# Patient Record
Sex: Male | Born: 1950
Health system: Southern US, Community
[De-identification: ages and names within clinical notes are randomized; demographics above are authoritative.]

## PROBLEM LIST (undated history)

## (undated) DIAGNOSIS — M1990 Unspecified osteoarthritis, unspecified site: Secondary | ICD-10-CM

## (undated) DIAGNOSIS — N400 Enlarged prostate without lower urinary tract symptoms: Secondary | ICD-10-CM

## (undated) DIAGNOSIS — F419 Anxiety disorder, unspecified: Secondary | ICD-10-CM

## (undated) DIAGNOSIS — C801 Malignant (primary) neoplasm, unspecified: Secondary | ICD-10-CM

## (undated) HISTORY — PX: KNEE ARTHROSCOPY: SHX127

## (undated) HISTORY — PX: PROSTATE BIOPSY: SHX241

---

## 1997-10-09 ENCOUNTER — Ambulatory Visit (HOSPITAL_COMMUNITY): Admission: RE | Admit: 1997-10-09 | Discharge: 1997-10-09 | Payer: Self-pay | Admitting: Neurosurgery

## 2002-06-26 ENCOUNTER — Ambulatory Visit (HOSPITAL_COMMUNITY): Admission: RE | Admit: 2002-06-26 | Discharge: 2002-06-26 | Payer: Self-pay | Admitting: Family Medicine

## 2002-06-26 ENCOUNTER — Encounter: Payer: Self-pay | Admitting: Family Medicine

## 2004-07-17 ENCOUNTER — Ambulatory Visit: Payer: Self-pay

## 2006-04-21 ENCOUNTER — Ambulatory Visit: Payer: Self-pay | Admitting: Gastroenterology

## 2006-04-28 ENCOUNTER — Ambulatory Visit: Payer: Self-pay | Admitting: Gastroenterology

## 2007-01-05 ENCOUNTER — Emergency Department (HOSPITAL_COMMUNITY): Admission: EM | Admit: 2007-01-05 | Discharge: 2007-01-05 | Payer: Self-pay | Admitting: Emergency Medicine

## 2010-07-08 HISTORY — PX: OTHER SURGICAL HISTORY: SHX169

## 2012-03-20 ENCOUNTER — Other Ambulatory Visit: Payer: Self-pay | Admitting: Orthopedic Surgery

## 2012-03-25 ENCOUNTER — Encounter (HOSPITAL_COMMUNITY): Admission: RE | Payer: Self-pay | Source: Ambulatory Visit

## 2012-03-25 ENCOUNTER — Ambulatory Visit (HOSPITAL_COMMUNITY): Admission: RE | Admit: 2012-03-25 | Payer: Self-pay | Source: Ambulatory Visit | Admitting: Orthopedic Surgery

## 2012-03-25 SURGERY — KYPHOPLASTY
Anesthesia: General | Laterality: Right

## 2012-09-07 ENCOUNTER — Emergency Department (HOSPITAL_COMMUNITY)
Admission: EM | Admit: 2012-09-07 | Discharge: 2012-09-07 | Disposition: A | Discharge status: Home / Self Care | Payer: Self-pay | Attending: Emergency Medicine | Admitting: Emergency Medicine

## 2012-09-07 ENCOUNTER — Encounter (HOSPITAL_COMMUNITY): Payer: Self-pay | Admitting: *Deleted

## 2012-09-07 DIAGNOSIS — R300 Dysuria: Secondary | ICD-10-CM | POA: Insufficient documentation

## 2012-09-07 DIAGNOSIS — Z79899 Other long term (current) drug therapy: Secondary | ICD-10-CM | POA: Insufficient documentation

## 2012-09-07 DIAGNOSIS — Z87440 Personal history of urinary (tract) infections: Secondary | ICD-10-CM | POA: Insufficient documentation

## 2012-09-07 DIAGNOSIS — R3915 Urgency of urination: Secondary | ICD-10-CM | POA: Insufficient documentation

## 2012-09-07 DIAGNOSIS — N400 Enlarged prostate without lower urinary tract symptoms: Secondary | ICD-10-CM

## 2012-09-07 DIAGNOSIS — N401 Enlarged prostate with lower urinary tract symptoms: Secondary | ICD-10-CM | POA: Insufficient documentation

## 2012-09-07 LAB — URINALYSIS, ROUTINE W REFLEX MICROSCOPIC
Bilirubin Urine: NEGATIVE
Nitrite: NEGATIVE
Specific Gravity, Urine: 1.016 (ref 1.005–1.030)
pH: 7 (ref 5.0–8.0)

## 2012-09-07 MED ORDER — CIPROFLOXACIN HCL 500 MG PO TABS: 500.0000 mg | ORAL_TABLET | Freq: Two times a day (BID) | ORAL | Status: DC

## 2012-09-07 NOTE — ED Provider Notes (Signed)
History     CSN: [id number]  Arrival date & time 09/07/12  1303   First MD Initiated Contact with Patient 09/07/12 1340      Chief Complaint  Patient presents with  . Urinary Retention    (Consider location/radiation/quality/duration/timing/severity/associated sxs/prior treatment) The history is provided by the patient.   Patient here complaining of dysuria and urinary urgency. No fever or back pain. No vomiting diarrhea. History of BPH in the past. Patient also recently diagnosed with a UTI and is on Bactrim. Patient notes she has been compliant with his medications. He denies any hematuria. Symptoms worse with urination and better with nothing History reviewed. No pertinent past medical history.  Past Surgical History  Procedure Laterality Date  . Prostate biopsy      History reviewed. No pertinent family history.  History  Substance Use Topics  . Smoking status: Never Smoker   . Smokeless tobacco: Never Used  . Alcohol Use: Yes      Review of Systems  All other systems reviewed and are negative.    Allergies  Review of patient's allergies indicates no known allergies.  Home Medications   Current Outpatient Rx  Name  Route  Sig  Dispense  Refill  . ALPRAZolam (XANAX) 1 MG tablet   Oral   Take 1 mg by mouth 3 (three) times daily.         . sertraline (ZOLOFT) 100 MG tablet   Oral   Take 50 mg by mouth daily.         Marland Kitchen sulfamethoxazole-trimethoprim (BACTRIM DS,SEPTRA DS) 800-160 MG per tablet   Oral   Take 1 tablet by mouth 2 (two) times daily.           BP 108/61  Pulse 80  Temp(Src) 98.7 F (37.1 C) (Oral)  Resp 18  SpO2 95%  Physical Exam  Nursing note and vitals reviewed. Constitutional: He is oriented to person, place, and time. He appears well-developed and well-nourished.  Non-toxic appearance. No distress.  HENT:  Head: Normocephalic and atraumatic.  Eyes: Conjunctivae, EOM and lids are normal. Pupils are equal, round, and  reactive to light.  Neck: Normal range of motion. Neck supple. No tracheal deviation present. No mass present.  Cardiovascular: Normal rate, regular rhythm and normal heart sounds.  Exam reveals no gallop.   No murmur heard. Pulmonary/Chest: Effort normal and breath sounds normal. No stridor. No respiratory distress. He has no decreased breath sounds. He has no wheezes. He has no rhonchi. He has no rales.  Abdominal: Soft. Normal appearance and bowel sounds are normal. He exhibits no distension. There is no tenderness. There is no rigidity, no rebound, no guarding and no CVA tenderness.  Musculoskeletal: Normal range of motion. He exhibits no edema and no tenderness.  Neurological: He is alert and oriented to person, place, and time. He has normal strength. No cranial nerve deficit or sensory deficit. GCS eye subscore is 4. GCS verbal subscore is 5. GCS motor subscore is 6.  Skin: Skin is warm and dry. No abrasion and no rash noted.  Psychiatric: He has a normal mood and affect. His speech is normal and behavior is normal.    ED Course  Procedures (including critical care time)  Labs Reviewed  URINE CULTURE  URINALYSIS, ROUTINE W REFLEX MICROSCOPIC   No results found.   No diagnosis found.    MDM  Patient to be treated for his presumed prostatitis with cipro--will stop bactrim  Toy Baker, MD 09/07/12 [phone]

## 2012-09-07 NOTE — ED Notes (Signed)
Pt states this past Friday started having a fever, dysuria, nausea, vomiting, went to doctor was told had a UTI and placed on Bactrim, states has been taking Bactrim but now having difficulty emptying bladder or getting urine to start. Pt states he feels sore in his genital area.

## 2012-09-07 NOTE — ED Notes (Signed)
Scott Flynn [phone] (daughters cellphone number)

## 2012-09-08 LAB — URINE CULTURE

## 2015-07-11 DIAGNOSIS — M778 Other enthesopathies, not elsewhere classified: Secondary | ICD-10-CM | POA: Diagnosis not present

## 2018-07-06 ENCOUNTER — Other Ambulatory Visit: Payer: Self-pay | Admitting: Otolaryngology

## 2018-07-06 DIAGNOSIS — J339 Nasal polyp, unspecified: Secondary | ICD-10-CM

## 2018-07-06 DIAGNOSIS — J300 Vasomotor rhinitis: Secondary | ICD-10-CM

## 2018-07-09 ENCOUNTER — Ambulatory Visit
Admission: RE | Admit: 2018-07-09 | Discharge: 2018-07-09 | Disposition: A | Discharge status: Home / Self Care | Payer: Medicare Other | Source: Ambulatory Visit | Attending: Otolaryngology | Admitting: Otolaryngology

## 2018-07-09 DIAGNOSIS — J300 Vasomotor rhinitis: Secondary | ICD-10-CM

## 2018-07-09 DIAGNOSIS — J339 Nasal polyp, unspecified: Secondary | ICD-10-CM

## 2018-07-17 ENCOUNTER — Other Ambulatory Visit: Payer: Self-pay

## 2018-07-17 ENCOUNTER — Encounter (HOSPITAL_BASED_OUTPATIENT_CLINIC_OR_DEPARTMENT_OTHER): Payer: Self-pay | Admitting: *Deleted

## 2018-07-20 ENCOUNTER — Encounter (HOSPITAL_COMMUNITY): Payer: Self-pay

## 2018-07-20 ENCOUNTER — Other Ambulatory Visit: Payer: Self-pay

## 2018-07-20 ENCOUNTER — Ambulatory Visit: Payer: Self-pay | Admitting: Otolaryngology

## 2018-07-20 NOTE — H&P (Signed)
PREOPERATIVE H&P  Chief Complaint: chronic nasal obstruction with history of chronic sinus problems  HPI: Scott Flynn is a 68 y.o. male who presents for evaluation of chronic nasal obstruction and sinus issues. He's had previous history of sinus problems more on the right side. But more recently has had trouble breathing through the left side of his nose despite multiple rounds of antibiotics and steroids. On exam he has sinonasal polyps left side worse than right. He has moderate septal deviation with posterior left septal spur. He had a CT scan that showed pansinus disease with minimal disease within the sphenoid region. He is taken to the operating room this time for FESS, turbinate reductions and possible septoplasty.  Past Medical History:  Diagnosis Date  . Anxiety   . Enlarged prostate    Past Surgical History:  Procedure Laterality Date  . KNEE ARTHROSCOPY    . PROSTATE BIOPSY     Social History   Socioeconomic History  . Marital status: Single    Spouse name: Not on file  . Number of children: Not on file  . Years of education: Not on file  . Highest education level: Not on file  Occupational History  . Not on file  Social Needs  . Financial resource strain: Not on file  . Food insecurity:    Worry: Not on file    Inability: Not on file  . Transportation needs:    Medical: Not on file    Non-medical: Not on file  Tobacco Use  . Smoking status: Never Smoker  . Smokeless tobacco: Never Used  Substance and Sexual Activity  . Alcohol use: Never    Frequency: Never  . Drug use: No  . Sexual activity: Not on file  Lifestyle  . Physical activity:    Days per week: Not on file    Minutes per session: Not on file  . Stress: Not on file  Relationships  . Social connections:    Talks on phone: Not on file    Gets together: Not on file    Attends religious service: Not on file    Active member of club or organization: Not on file    Attends meetings of clubs  or organizations: Not on file    Relationship status: Not on file  Other Topics Concern  . Not on file  Social History Narrative  . Not on file   History reviewed. No pertinent family history. No Known Allergies Prior to Admission medications   Medication Sig Start Date End Date Taking? Authorizing Provider  ALPRAZolam Duanne Moron) 1 MG tablet Take 1 mg by mouth 3 (three) times daily.   Yes [provider]  fluticasone (FLONASE) 50 MCG/ACT nasal spray Place 2 sprays into both nostrils at bedtime.   Yes [provider]  tamsulosin (FLOMAX) 0.4 MG CAPS capsule Take 0.4 mg by mouth every evening.    Yes [provider]     Positive ROS: chronic nasal obstruction per history of present illness  All other systems have been reviewed and were otherwise negative with the exception of those mentioned in the HPI and as above.  Physical Exam: There were no vitals filed for this visit.  General: Alert, no acute distress Oral: Normal oral mucosa and tonsils Nasal: moderate septal deviation with large turbinates. Diffuse mucosal swelling with polyps within the posterior nasal cavity Neck: No palpable adenopathy or thyroid nodules Ear: Ear canal is clear with normal appearing TMs Cardiovascular: Regular rate and rhythm,  no murmur.  Respiratory: Clear to auscultation Neurologic: Alert and oriented x 3   Assessment/Plan: deviated septum, hypertrophy of turbinates, polypoid sinus degeneration Plan for Procedure(s): NASAL SEPTOPLASTY WITH TURBINATE REDUCTION TOTAL ETHMOIDECTOMY ENDOSCOPIC SINUS SURGERY, MAXILLARY ENLARGEMENT SINUS ENDO WITH FUSION   Melony Overly, MD 07/20/2018 10:10 AM

## 2018-07-20 NOTE — Progress Notes (Signed)
PCP - unknown  Cardiologist - Denies  Chest x-ray - Denies  EKG - 01/06/18 (CE)-Req'd- Pt sts he was dieting at the time of the dizziness episodes. No new episode since then, and he is no longer dieting.  Stress Test - Denies  ECHO - Denies  Cardiac Cath - Denies  AICD- na PM- na LOOP- na  Sleep Study - Denies CPAP - None  LABS- 07/21/2018: CBC, BMP  ASA- Denies    Anesthesia- Yes- Req'd EKG.  Pt denies having any chest pain, sob, or fever during the pre-op phone call. All instructions explained to the pt, with a verbal understanding of the material including: As of today, stop taking all Other Aspirin Products, Vitamins, Fish oils, and Herbal medications. Also stop all NSAIDS i.e. Advil, Ibuprofen, Motrin, Aleve, Anaprox, Naproxen, BC, Goody Powders, and all Supplements.. The opportunity to ask questions was provided.

## 2018-07-21 ENCOUNTER — Encounter (HOSPITAL_COMMUNITY): Payer: Self-pay

## 2018-07-21 ENCOUNTER — Ambulatory Visit (HOSPITAL_COMMUNITY)
Admission: RE | Admit: 2018-07-21 | Discharge: 2018-07-21 | Disposition: A | Discharge status: Home / Self Care | Payer: Medicare Other | Attending: Otolaryngology | Admitting: Otolaryngology

## 2018-07-21 ENCOUNTER — Ambulatory Visit (HOSPITAL_COMMUNITY): Payer: Medicare Other | Admitting: Certified Registered Nurse Anesthetist

## 2018-07-21 ENCOUNTER — Encounter (HOSPITAL_COMMUNITY)
Admission: RE | Disposition: A | Discharge status: Home / Self Care | Payer: Self-pay | Source: Home / Self Care | Attending: Otolaryngology

## 2018-07-21 DIAGNOSIS — J339 Nasal polyp, unspecified: Secondary | ICD-10-CM | POA: Diagnosis not present

## 2018-07-21 DIAGNOSIS — J331 Polypoid sinus degeneration: Secondary | ICD-10-CM | POA: Diagnosis not present

## 2018-07-21 DIAGNOSIS — Z7951 Long term (current) use of inhaled steroids: Secondary | ICD-10-CM | POA: Diagnosis not present

## 2018-07-21 DIAGNOSIS — F419 Anxiety disorder, unspecified: Secondary | ICD-10-CM | POA: Diagnosis not present

## 2018-07-21 DIAGNOSIS — J343 Hypertrophy of nasal turbinates: Secondary | ICD-10-CM | POA: Insufficient documentation

## 2018-07-21 DIAGNOSIS — J342 Deviated nasal septum: Secondary | ICD-10-CM | POA: Insufficient documentation

## 2018-07-21 DIAGNOSIS — N400 Enlarged prostate without lower urinary tract symptoms: Secondary | ICD-10-CM | POA: Insufficient documentation

## 2018-07-21 DIAGNOSIS — Z79899 Other long term (current) drug therapy: Secondary | ICD-10-CM | POA: Insufficient documentation

## 2018-07-21 HISTORY — PX: SINUS ENDO WITH FUSION: SHX5329

## 2018-07-21 HISTORY — PX: ETHMOIDECTOMY: SHX5197

## 2018-07-21 HISTORY — DX: Benign prostatic hyperplasia without lower urinary tract symptoms: N40.0

## 2018-07-21 HISTORY — PX: NASAL SEPTOPLASTY W/ TURBINOPLASTY: SHX2070

## 2018-07-21 HISTORY — PX: NASAL SINUS SURGERY: SHX719

## 2018-07-21 HISTORY — DX: Anxiety disorder, unspecified: F41.9

## 2018-07-21 LAB — BASIC METABOLIC PANEL
ANION GAP: 8 (ref 5–15)
BUN: 18 mg/dL (ref 8–23)
CHLORIDE: 109 mmol/L (ref 98–111)
CO2: 21 mmol/L — ABNORMAL LOW (ref 22–32)
CREATININE: 0.89 mg/dL (ref 0.61–1.24)
Calcium: 8.8 mg/dL — ABNORMAL LOW (ref 8.9–10.3)
GFR calc Af Amer: >60 mL/min (ref >60)
GFR calc non Af Amer: >60 mL/min (ref >60)
GLUCOSE: 95 mg/dL (ref 70–99)
POTASSIUM: 3.9 mmol/L (ref 3.5–5.1)
Sodium: 138 mmol/L (ref 135–145)

## 2018-07-21 LAB — CBC
HCT: 44.1 % (ref 39.0–52.0)
Hemoglobin: 14.9 g/dL (ref 13.0–17.0)
MCH: 31.8 pg (ref 26.0–34.0)
MCHC: 33.8 g/dL (ref 30.0–36.0)
MCV: 94.2 fL (ref 80.0–100.0)
NRBC: 0 % (ref 0.0–0.2)
Platelets: 194 10*3/uL (ref 150–400)
RBC: 4.68 MIL/uL (ref 4.22–5.81)
RDW: 13.2 % (ref 11.5–15.5)
WBC: 6.5 10*3/uL (ref 4.0–10.5)

## 2018-07-21 SURGERY — SEPTOPLASTY, NOSE, WITH NASAL TURBINATE REDUCTION
Anesthesia: General | Site: Nose

## 2018-07-21 MED ORDER — MIDAZOLAM HCL 2 MG/2ML IJ SOLN: 1.0000 mg | INTRAMUSCULAR | Status: DC | PRN

## 2018-07-21 MED ORDER — LIDOCAINE-EPINEPHRINE 1 %-1:100000 IJ SOLN
INTRAMUSCULAR | Status: AC
  Filled 2018-07-21: qty 1

## 2018-07-21 MED ORDER — ONDANSETRON HCL 4 MG/2ML IJ SOLN
INTRAMUSCULAR | Status: DC | PRN
  Administered 2018-07-21: 4 mg via INTRAVENOUS

## 2018-07-21 MED ORDER — CEFAZOLIN SODIUM-DEXTROSE 2-4 GM/100ML-% IV SOLN
2.0000 g | INTRAVENOUS | Status: AC
  Administered 2018-07-21: 2 g via INTRAVENOUS
  Filled 2018-07-21: qty 100

## 2018-07-21 MED ORDER — LACTATED RINGERS IV SOLN
INTRAVENOUS | Status: DC
  Administered 2018-07-21 (×2): via INTRAVENOUS

## 2018-07-21 MED ORDER — SODIUM CHLORIDE 0.9 % IR SOLN
Status: DC | PRN
  Administered 2018-07-21: 1000 mL

## 2018-07-21 MED ORDER — COCAINE HCL 4 % EX SOLN
CUTANEOUS | Status: AC
  Filled 2018-07-21: qty 4

## 2018-07-21 MED ORDER — FENTANYL CITRATE (PF) 250 MCG/5ML IJ SOLN
INTRAMUSCULAR | Status: DC | PRN
  Administered 2018-07-21 (×4): 50 ug via INTRAVENOUS
  Administered 2018-07-21 (×2): 25 ug via INTRAVENOUS
  Administered 2018-07-21: 100 ug via INTRAVENOUS
  Administered 2018-07-21 (×3): 50 ug via INTRAVENOUS

## 2018-07-21 MED ORDER — MUPIROCIN 2 % EX OINT
TOPICAL_OINTMENT | CUTANEOUS | Status: AC
  Filled 2018-07-21: qty 22

## 2018-07-21 MED ORDER — SUGAMMADEX SODIUM 200 MG/2ML IV SOLN
INTRAVENOUS | Status: DC | PRN
  Administered 2018-07-21: 200 mg via INTRAVENOUS

## 2018-07-21 MED ORDER — ROCURONIUM BROMIDE 10 MG/ML (PF) SYRINGE
PREFILLED_SYRINGE | INTRAVENOUS | Status: DC | PRN
  Administered 2018-07-21: 50 mg via INTRAVENOUS

## 2018-07-21 MED ORDER — ONDANSETRON HCL 4 MG/2ML IJ SOLN: 4.0000 mg | Freq: Once | INTRAMUSCULAR | Status: DC | PRN

## 2018-07-21 MED ORDER — BACITRACIN ZINC 500 UNIT/GM EX OINT
TOPICAL_OINTMENT | CUTANEOUS | Status: AC
  Filled 2018-07-21: qty 28.35

## 2018-07-21 MED ORDER — LIDOCAINE-EPINEPHRINE 1 %-1:100000 IJ SOLN
INTRAMUSCULAR | Status: DC | PRN
  Administered 2018-07-21: 10 mL

## 2018-07-21 MED ORDER — 0.9 % SODIUM CHLORIDE (POUR BTL) OPTIME
TOPICAL | Status: DC | PRN
  Administered 2018-07-21: 1000 mL

## 2018-07-21 MED ORDER — DEXAMETHASONE SODIUM PHOSPHATE 10 MG/ML IJ SOLN
INTRAMUSCULAR | Status: DC | PRN
  Administered 2018-07-21: 10 mg via INTRAVENOUS

## 2018-07-21 MED ORDER — MUPIROCIN 2 % EX OINT
TOPICAL_OINTMENT | CUTANEOUS | Status: DC | PRN
  Administered 2018-07-21: 1 via TOPICAL

## 2018-07-21 MED ORDER — HYDROCODONE-ACETAMINOPHEN 5-325 MG PO TABS: 1.0000 | ORAL_TABLET | Freq: Four times a day (QID) | ORAL | 0 refills | Status: AC | PRN

## 2018-07-21 MED ORDER — FENTANYL CITRATE (PF) 100 MCG/2ML IJ SOLN: 25.0000 ug | INTRAMUSCULAR | Status: DC | PRN

## 2018-07-21 MED ORDER — PROPOFOL 10 MG/ML IV BOLUS
INTRAVENOUS | Status: AC
  Filled 2018-07-21: qty 20

## 2018-07-21 MED ORDER — CEPHALEXIN 500 MG PO CAPS: 500.0000 mg | ORAL_CAPSULE | Freq: Two times a day (BID) | ORAL | 0 refills | Status: AC

## 2018-07-21 MED ORDER — EPHEDRINE 5 MG/ML INJ
INTRAVENOUS | Status: AC
  Filled 2018-07-21: qty 10

## 2018-07-21 MED ORDER — FENTANYL CITRATE (PF) 100 MCG/2ML IJ SOLN: 50.0000 ug | INTRAMUSCULAR | Status: DC | PRN

## 2018-07-21 MED ORDER — OXYCODONE HCL 5 MG/5ML PO SOLN: 5.0000 mg | Freq: Once | ORAL | Status: DC | PRN

## 2018-07-21 MED ORDER — MIDAZOLAM HCL 2 MG/2ML IJ SOLN
INTRAMUSCULAR | Status: DC | PRN
  Administered 2018-07-21: 1 mg via INTRAVENOUS

## 2018-07-21 MED ORDER — OXYMETAZOLINE HCL 0.05 % NA SOLN
NASAL | Status: AC
  Filled 2018-07-21: qty 15

## 2018-07-21 MED ORDER — FENTANYL CITRATE (PF) 250 MCG/5ML IJ SOLN
INTRAMUSCULAR | Status: AC
  Filled 2018-07-21: qty 5

## 2018-07-21 MED ORDER — MIDAZOLAM HCL 2 MG/2ML IJ SOLN
INTRAMUSCULAR | Status: AC
  Filled 2018-07-21: qty 2

## 2018-07-21 MED ORDER — SCOPOLAMINE 1 MG/3DAYS TD PT72
1.0000 | MEDICATED_PATCH | Freq: Once | TRANSDERMAL | Status: DC | PRN
Start: 2018-07-21 — End: 2018-07-21
  Administered 2018-07-21: 1.5 mg via TRANSDERMAL
  Filled 2018-07-21: qty 1

## 2018-07-21 MED ORDER — CHLORHEXIDINE GLUCONATE CLOTH 2 % EX PADS: 6.0000 | MEDICATED_PAD | Freq: Once | CUTANEOUS | Status: DC

## 2018-07-21 MED ORDER — PROPOFOL 10 MG/ML IV BOLUS
INTRAVENOUS | Status: DC | PRN
  Administered 2018-07-21: 170 mg via INTRAVENOUS

## 2018-07-21 MED ORDER — OXYMETAZOLINE HCL 0.05 % NA SOLN
NASAL | Status: DC | PRN
  Administered 2018-07-21: 1

## 2018-07-21 MED ORDER — LIDOCAINE 2% (20 MG/ML) 5 ML SYRINGE
INTRAMUSCULAR | Status: DC | PRN
  Administered 2018-07-21: 40 mg via INTRAVENOUS

## 2018-07-21 MED ORDER — EPHEDRINE SULFATE 50 MG/ML IJ SOLN
INTRAMUSCULAR | Status: DC | PRN
  Administered 2018-07-21 (×4): 5 mg via INTRAVENOUS
  Administered 2018-07-21: 10 mg via INTRAVENOUS

## 2018-07-21 MED ORDER — OXYCODONE HCL 5 MG PO TABS: 5.0000 mg | ORAL_TABLET | Freq: Once | ORAL | Status: DC | PRN

## 2018-07-21 SURGICAL SUPPLY — 65 items
ATTRACTOMAT 16X20 MAGNETIC DRP (DRAPES) IMPLANT
BLADE 10 SAFETY STRL DISP (BLADE) ×2 IMPLANT
BLADE INF TURB ROT M4 2 5PK (BLADE) ×2 IMPLANT
BLADE RAD40 ROTATE 4M 4 5PK (BLADE) IMPLANT
BLADE RAD60 ROTATE M4 4 5PK (BLADE) IMPLANT
BLADE ROTATE TRICUT 4X13 M4 (BLADE) ×1 IMPLANT
BLADE SURG 15 STRL LF DISP TIS (BLADE) ×1 IMPLANT
BLADE SURG 15 STRL SS (BLADE) ×2
BLADE TRICUT ROTATE M4 4 5PK (BLADE) IMPLANT
CANISTER SUCT 3000ML (MISCELLANEOUS) ×2 IMPLANT
CANISTER SUCT 3000ML PPV (MISCELLANEOUS) ×2 IMPLANT
CLEANER TIP ELECTROSURG 2X2 (MISCELLANEOUS) ×2 IMPLANT
COAGULATOR SUCT 8FR VV (MISCELLANEOUS) IMPLANT
COAGULATOR SUCT SWTCH 10FR 6 (ELECTROSURGICAL) ×2 IMPLANT
COVER WAND RF STERILE (DRAPES) ×2 IMPLANT
DRAPE HALF SHEET 40X57 (DRAPES) IMPLANT
DRESSING NASAL KENNEDY 3.5X.9 (MISCELLANEOUS) IMPLANT
DRESSING TELFA 8X10 (GAUZE/BANDAGES/DRESSINGS) IMPLANT
DRSG NASAL KENNEDY 3.5X.9 (MISCELLANEOUS)
DRSG NASOPORE 8CM (GAUZE/BANDAGES/DRESSINGS) ×2 IMPLANT
DRSG TELFA 3X8 NADH (GAUZE/BANDAGES/DRESSINGS) IMPLANT
ELECT COATED BLADE 2.86 ST (ELECTRODE) ×2 IMPLANT
ELECT REM PT RETURN 9FT ADLT (ELECTROSURGICAL)
ELECTRODE REM PT RTRN 9FT ADLT (ELECTROSURGICAL) IMPLANT
FILTER ARTHROSCOPY CONVERTOR (FILTER) IMPLANT
GAUZE SPONGE 2X2 8PLY STRL LF (GAUZE/BANDAGES/DRESSINGS) IMPLANT
GLOVE SS BIOGEL STRL SZ 7.5 (GLOVE) ×2 IMPLANT
GLOVE SUPERSENSE BIOGEL SZ 7.5 (GLOVE) ×2
GOWN STRL REUS W/ TWL LRG LVL3 (GOWN DISPOSABLE) ×1 IMPLANT
GOWN STRL REUS W/ TWL XL LVL3 (GOWN DISPOSABLE) ×1 IMPLANT
GOWN STRL REUS W/TWL LRG LVL3 (GOWN DISPOSABLE) ×2
GOWN STRL REUS W/TWL XL LVL3 (GOWN DISPOSABLE) ×2
KIT BASIN OR (CUSTOM PROCEDURE TRAY) ×2 IMPLANT
KIT TURNOVER KIT B (KITS) ×2 IMPLANT
MARKER SPHERE PSV REFLC NDI (MISCELLANEOUS) ×10 IMPLANT
NDL HYPO 25GX1X1/2 BEV (NEEDLE) IMPLANT
NDL PRECISIONGLIDE 27X1.5 (NEEDLE) ×1 IMPLANT
NDL SPNL 25GX3.5 QUINCKE BL (NEEDLE) IMPLANT
NEEDLE HYPO 25GX1X1/2 BEV (NEEDLE) IMPLANT
NEEDLE PRECISIONGLIDE 27X1.5 (NEEDLE) ×2 IMPLANT
NEEDLE SPNL 25GX3.5 QUINCKE BL (NEEDLE) IMPLANT
NS IRRIG 1000ML POUR BTL (IV SOLUTION) ×2 IMPLANT
PAD ARMBOARD 7.5X6 YLW CONV (MISCELLANEOUS) ×4 IMPLANT
PAD DRESSING TELFA 3X8 NADH (GAUZE/BANDAGES/DRESSINGS) IMPLANT
PATTIES SURGICAL .5 X3 (DISPOSABLE) ×3 IMPLANT
PENCIL BUTTON HOLSTER BLD 10FT (ELECTRODE) IMPLANT
PENCIL FOOT CONTROL (ELECTRODE) ×2 IMPLANT
SOLUTION ANTI FOG 6CC (MISCELLANEOUS) ×2 IMPLANT
SPECIMEN JAR SMALL (MISCELLANEOUS) ×4 IMPLANT
SPLINT NASAL DOYLE BI-VL (GAUZE/BANDAGES/DRESSINGS) IMPLANT
SPONGE GAUZE 2X2 STER 10/PKG (GAUZE/BANDAGES/DRESSINGS)
STRIP CLOSURE SKIN 1/2X4 (GAUZE/BANDAGES/DRESSINGS) IMPLANT
SUT CHROMIC 3 0 PS 2 (SUTURE) IMPLANT
SUT CHROMIC 4 0 PS 2 18 (SUTURE) IMPLANT
SUT CHROMIC 5 0 P 3 (SUTURE) IMPLANT
SUT ETHILON 3 0 PS 1 (SUTURE) IMPLANT
SUT ETHILON 4 0 PS 2 18 (SUTURE) IMPLANT
SUT ETHILON 5 0 P 3 18 (SUTURE)
SUT NYLON ETHILON 5-0 P-3 1X18 (SUTURE) IMPLANT
SUT SILK 2 0 PERMA HAND 18 BK (SUTURE) ×2 IMPLANT
TOWEL OR 17X24 6PK STRL BLUE (TOWEL DISPOSABLE) ×2 IMPLANT
TRAY ENT MC OR (CUSTOM PROCEDURE TRAY) ×2 IMPLANT
TUBE CONNECTING 12X1/4 (SUCTIONS) ×2 IMPLANT
TUBE CONNECTING 20X1/4 (TUBING) ×1 IMPLANT
WATER STERILE IRR 1000ML POUR (IV SOLUTION) ×2 IMPLANT

## 2018-07-21 NOTE — Anesthesia Postprocedure Evaluation (Signed)
Anesthesia Post Note  Patient: Scott Flynn  Procedure(s) Performed: NASAL SEPTOPLASTY WITH TURBINATE REDUCTION (N/A Nose) TOTAL ETHMOIDECTOMY (N/A Nose) ENDOSCOPIC SINUS SURGERY, MAXILLARY ENLARGEMENT (N/A Nose) SINUS ENDO WITH FUSION (N/A Nose)     Patient location during evaluation: PACU Anesthesia Type: General Level of consciousness: awake and alert Pain management: pain level controlled Vital Signs Assessment: post-procedure vital signs reviewed and stable Respiratory status: spontaneous breathing, nonlabored ventilation, respiratory function stable and patient connected to nasal cannula oxygen Cardiovascular status: blood pressure returned to baseline and stable Postop Assessment: no apparent nausea or vomiting Anesthetic complications: no    Last Vitals:  Vitals:   07/21/18 0627 07/21/18 1045  BP: (!) 142/69 135/83  Pulse: 63 89  Resp: 18 12  Temp: 36.9 C (!) 36.4 C  SpO2: 96% 94%    Last Pain:  Vitals:   07/21/18 1045  TempSrc:   PainSc: 0-No pain                 [name]

## 2018-07-21 NOTE — Op Note (Signed)
NAME: Scott Flynn, Scott Flynn. 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  Turbinate hypertrophy.  Deviated septum.  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  Turbinate hypertrophy.  Deviated septum.  OPERATION PERFORMED:  Functional endoscopic sinus surgery with bilateral total ethmoidectomies and bilateral maxillary ostium enlargement with removal of polypoid disease using fusion.  Bilateral inferior turbinate reductions using the Medtronic  turbinate blade.  SURGEON:  Melony Overly, MD  ANESTHESIA:  General endotracheal.  ESTIMATED BLOOD LOSS:  150 mL.  COMPLICATIONS:  None.  BRIEF CLINICAL NOTE:  The patient is a 68 year old gentleman who has had longstanding chronic sinus problems with a history of nasal obstruction which has been worse on the left side more recently.  He underwent a CT scan that showed bilateral sinus  disease with intranasal polyps, worse on the left side.  He also has a moderate septal deflection.  He was taken to the operating room at this time for functional endoscopic sinus surgery with removal of intranasal and sinus polyps along with  ethmoidectomies and maxillary ostium enlargement.  He is going to undergo inferior turbinate reductions and possible septoplasty depending on findings at time of surgery.  DESCRIPTION OF PROCEDURE:  After adequate endotracheal anesthesia, the patient did have a moderate septal deflection, but this really was not obstructing access to the middle meatus on either side.  Most of his nasal obstruction is probably more related  to the polypoid disease that has fallen into the nasal cavity versus the septal deformity.  It was elected not to  perform septoplasty at this time and just perform a functional endoscopic sinus surgery with turbinate reductions.  After prepping the nose,  the nose was then decongested with Afrin in middle meatus areas, and inferior turbinates were injected with Xylocaine with epinephrine for hemostasis.  First, the right side was approached.  Uncinate process was incised and removed using the  microdebrider.  The anterior and a portion of the posterior ethmoid cells were opened up.  Fusion guidance was used to guide the surgery.  Care was taken to preserve the lamina papyracea as well as the roof of the ethmoid.  Of note, the ethmoid septa  were very thickened from chronic sinus disease.  In addition, the patient had polyps in the middle meatus as well as medial to the middle turbinate.  In order to remove the polyps from the medial aspect of the middle turbinates, the middle turbinate was  reflected laterally, and polyps were removed from the medial aspect of the middle turbinates posteriorly on both sides.  The patient had moderate bleeding during the procedure that was controlled with cotton pledgets soaked in Afrin.  On the right side,  the maxillary ostium was identified and was enlarged with backbiting and straight Thru-Cut forceps.  The procedure was repeated on the left side.  Again, the uncinate process was incised and removed.  Microdebrider was used to open up the anterior and  posterior ethmoid cells using Fusion guidance.  Maxillary ostia were then identified and were enlarged with backbiting and straight Thru-Cut forceps as well as flag-biting forceps.  Both maxillary ostia were enlarged to approximately a centimeter size.   Anterior and posterior ethmoid cells were opened up on both sides using Fusion navigation.  Polypoid disease was removed on the  left side.  The patient had large polyps extruding down posteriorly into the nasal cavity.  These were removed and sent as  specimens.  The patient also had  several polyps protruding from the medial aspect of the middle turbinates on both sides.  These were likewise removed with Takahashi forceps and suction cautery.  Following ethmoidectomies and maxillary ostium  enlargement, the inferior turbinate reductions were performed using Medtronic turbinate blade, submucosal turbinate reductions were performed bilaterally, and the turbinates were outfractured bilaterally.  Hemostasis was obtained with suction cautery.   Nasopore was used to pack the middle meatus on both sides.  This completed the procedure.  The patient was awoken from anesthesia and transferred to recovery room and postop doing well.  DISPOSITION:  The patient will be discharged home on Keflex 500 mg b.i.d. for 10 days, Tylenol, hydrocodone and ibuprofen p.r.n. pain.  He will follow up in my office in 3 days for recheck and cleaning the nasal cavity.  LN/NUANCE  D:07/21/2018 T:07/21/2018 JOB:[id number]

## 2018-07-21 NOTE — Anesthesia Preprocedure Evaluation (Signed)
Anesthesia Evaluation  Patient identified by MRN, date of birth, ID band Patient awake    Reviewed: Allergy & Precautions, NPO status , Patient's Chart, lab work & pertinent test results  Airway Mallampati: II  TM Distance: >3 FB Neck ROM: Full    Dental  (+) Teeth Intact, Dental Advisory Given   Pulmonary    breath sounds clear to auscultation       Cardiovascular  Rhythm:Regular Rate:Normal     Neuro/Psych    GI/Hepatic   Endo/Other    Renal/GU      Musculoskeletal   Abdominal   Peds  Hematology   Anesthesia Other Findings   Reproductive/Obstetrics                             Anesthesia Physical Anesthesia Plan  ASA: II  Anesthesia Plan: General   Post-op Pain Management:    Induction: Intravenous  PONV Risk Score and Plan: Dexamethasone and Ondansetron  Airway Management Planned: Oral ETT  Additional Equipment:   Intra-op Plan:   Post-operative Plan: Extubation in OR  Informed Consent: I have reviewed the patients History and Physical, chart, labs and discussed the procedure including the risks, benefits and alternatives for the proposed anesthesia with the patient or authorized representative who has indicated his/her understanding and acceptance.     Dental advisory given  Plan Discussed with: CRNA and Anesthesiologist  Anesthesia Plan Comments:         Anesthesia Quick Evaluation  

## 2018-07-21 NOTE — Transfer of Care (Signed)
Immediate Anesthesia Transfer of Care Note  Patient: Scott Flynn  Procedure(s) Performed: NASAL SEPTOPLASTY WITH TURBINATE REDUCTION (N/A Nose) TOTAL ETHMOIDECTOMY (N/A Nose) ENDOSCOPIC SINUS SURGERY, MAXILLARY ENLARGEMENT (N/A Nose) SINUS ENDO WITH FUSION (N/A Nose)  Patient Location: PACU  Anesthesia Type:General  Level of Consciousness: awake, alert  and patient cooperative  Airway & Oxygen Therapy: Patient Spontanous Breathing and Patient connected to face mask oxygen  Post-op Assessment: Report given to RN and Post -op Vital signs reviewed and stable  Post vital signs: Reviewed and stable  Last Vitals:  Vitals Value Taken Time  BP 135/83 07/21/2018 10:45 AM  Temp 36.4 C 07/21/2018 10:45 AM  Pulse 89 07/21/2018 10:47 AM  Resp 16 07/21/2018 10:47 AM  SpO2 92 % 07/21/2018 10:47 AM  Vitals shown include unvalidated device data.  Last Pain:  Vitals:   07/21/18 1045  TempSrc:   PainSc: 0-No pain      Patients Stated Pain Goal: 0 (88/89/16 9450)  Complications: No apparent anesthesia complications

## 2018-07-21 NOTE — Discharge Instructions (Addendum)
Keflex 500 mg BID for the next 10 days Tylenol, ibuprofen and or hydrocodone 5 mg 1-2 every 6 hrs prn pain Elevate head of bed and apply cool compress to the nose to reduce swelling and bleeding You can rinse the nasal cavity with saline nasal spray to reduce crusting and scabbing Return to see Dr Lucia Gaskins Friday at 4:15

## 2018-07-21 NOTE — Anesthesia Procedure Notes (Signed)
Procedure Name: Intubation Date/Time: 07/21/2018 7:45 AM Performed by: Elayne Snare, CRNA Pre-anesthesia Checklist: Patient identified, Emergency Drugs available, Suction available and Patient being monitored Patient Re-evaluated:Patient Re-evaluated prior to induction Oxygen Delivery Method: Circle System Utilized Preoxygenation: Pre-oxygenation with 100% oxygen Induction Type: IV induction Ventilation: Mask ventilation without difficulty Laryngoscope Size: Mac and 4 Grade View: Grade I Tube type: Oral Tube size: 7.5 mm Number of attempts: 1 Airway Equipment and Method: Stylet Placement Confirmation: ETT inserted through vocal cords under direct vision,  positive ETCO2 and breath sounds checked- equal and bilateral Secured at: 22 cm Tube secured with: Tape Dental Injury: Teeth and Oropharynx as per pre-operative assessment

## 2018-07-21 NOTE — Brief Op Note (Signed)
07/21/2018  10:33 AM  PATIENT:  Scott Flynn  68 y.o. male  PRE-OPERATIVE DIAGNOSIS:  deviated septum, hypertrophy of turbinates, polypoid sinus degeneration  POST-OPERATIVE DIAGNOSIS:  deviated septum, hypertrophy of turbinates, polypoid sinus degeneration  PROCEDURE:  FESS with BILATERAL TOTAL ETHMOIDECTOMY, BILATERAL MAXILLARY OSTIA ENLARGEMENT, with FUSION, BILATERAL INFERIOR TURBINATE REDUUCTIONS  SURGEON:  Surgeon(s) and Role:    Rozetta Nunnery, MD - Primary  PHYSICIAN ASSISTANT:   ASSISTANTS: none   ANESTHESIA:   general  EBL:  50 mL   BLOOD ADMINISTERED:none  DRAINS: none   LOCAL MEDICATIONS USED:  XYLOCAINE with EPI 15 cc  SPECIMEN:  Source of Specimen:  sinus polyps  DISPOSITION OF SPECIMEN:  PATHOLOGY  COUNTS:  YES  TOURNIQUET:  * No tourniquets in log *  DICTATION: .Other Dictation: Dictation Number [id number]  PLAN OF CARE: Discharge to home after PACU  PATIENT DISPOSITION:  PACU - hemodynamically stable.   Delay start of Pharmacological VTE agent (>24hrs) due to surgical blood loss or risk of bleeding: yes

## 2018-07-21 NOTE — Interval H&P Note (Signed)
History and Physical Interval Note:  07/21/2018 7:42 AM  Scott Flynn  has presented today for surgery, with the diagnosis of deviated septum, hypertrophy of turbinates, polypoid sinus degeneration  The various methods of treatment have been discussed with the patient and family. After consideration of risks, benefits and other options for treatment, the patient has consented to  Procedure(s): NASAL SEPTOPLASTY WITH TURBINATE REDUCTION (N/A) TOTAL ETHMOIDECTOMY (N/A) ENDOSCOPIC SINUS SURGERY, MAXILLARY ENLARGEMENT (N/A) SINUS ENDO WITH FUSION (N/A) as a surgical intervention .  The patient's history has been reviewed, patient examined, no change in status, stable for surgery.  I have reviewed the patient's chart and labs.  Questions were answered to the patient's satisfaction.     Melony Overly

## 2018-07-22 ENCOUNTER — Encounter (HOSPITAL_COMMUNITY): Payer: Self-pay | Admitting: Otolaryngology

## 2020-11-27 ENCOUNTER — Other Ambulatory Visit: Payer: Self-pay | Admitting: Urology

## 2020-11-27 DIAGNOSIS — R972 Elevated prostate specific antigen [PSA]: Secondary | ICD-10-CM

## 2020-12-14 ENCOUNTER — Ambulatory Visit
Admission: RE | Admit: 2020-12-14 | Discharge: 2020-12-14 | Disposition: A | Discharge status: Home / Self Care | Payer: Medicare Other | Source: Ambulatory Visit | Attending: Urology | Admitting: Urology

## 2020-12-14 ENCOUNTER — Other Ambulatory Visit: Payer: Self-pay

## 2020-12-14 DIAGNOSIS — R972 Elevated prostate specific antigen [PSA]: Secondary | ICD-10-CM

## 2020-12-14 IMAGING — MR MR PROSTATE WO/W CM
12 series · 48 of 48 positions shown · IV contrast (multihance)
Comparison: None.

CLINICAL DATA: Elevated PSA 9.77, evaluate for prostate cancer

EXAM:
MR PROSTATE WITHOUT AND WITH CONTRAST
TECHNIQUE: Multiplanar multisequence MRI images were obtained of the pelvis
centered about the prostate. Pre and post contrast images were
obtained.
CONTRAST:  20mL MULTIHANCE GADOBENATE DIMEGLUMINE 529 MG/ML IV SOLN

[Series 3: T2 · coronal · 3.0mm · 0.56mm/px · 1 of 30 slices shown (1 of 3)]
[im 1/30]
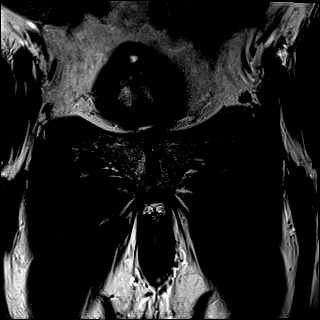

[Series 4: T1 · axial · 5.0mm · 1.25mm/px · 1 of 80 slices shown]
[im 1/80]
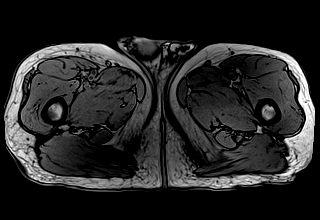

[Series 5: DWI · axial · 3.0mm · 1.75mm/px · 1 of 75 slices shown (1 of 3)]
[im 1/75]
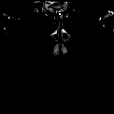

[Series 6: DWI · axial · 3.0mm · 1.75mm/px · 1 of 25 slices shown (2 of 3)]
[im 1/25]
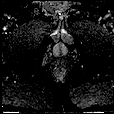

[Series 7: DWI · axial · 3.0mm · 1.75mm/px · 1 of 25 slices shown (3 of 3)]
[im 1/25]
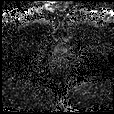

[Series 8: T2 · axial · 3.0mm · 0.56mm/px · 1 of 30 slices shown (2 of 3)]
[im 1/30]
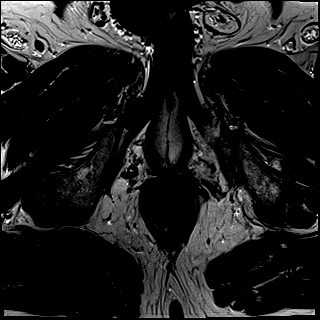

[Series 9: T2 · axial · 1.4mm · 1.35mm/px · z∈[-70,+41]mm · 2 of 80 slices shown (3 of 3)]
[im 1/80]
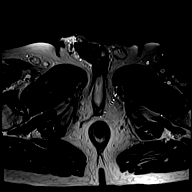
[im 80/80]
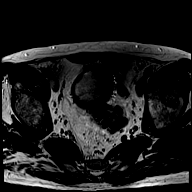

[Series 10: pre t1_twist_tra_dyn · axial · non-contrast · 3.5mm · 0.83mm/px · 1 of 24 slices shown]
[im 1/24]
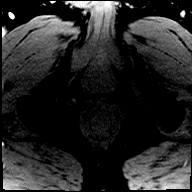

[Series 11: post t1_twist_tra_dyn-copy center · axial · non-contrast · 3.5mm · 0.83mm/px · z∈[-62,+19]mm · 18 of 720 slices shown]
[im 1/720]
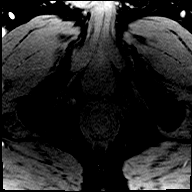
[im 43/720]
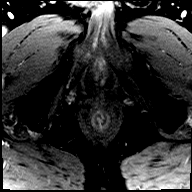
[im 85/720]
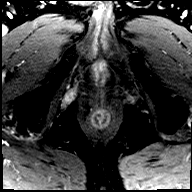
[im 127/720]
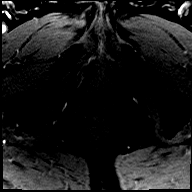
[im 170/720]
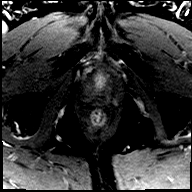
[im 212/720]
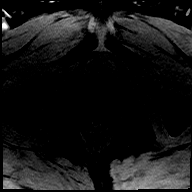
[im 254/720]
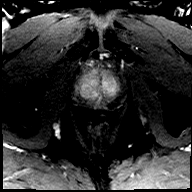
[im 297/720]
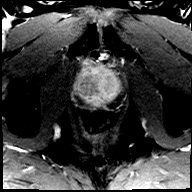
[im 339/720]
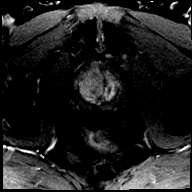
[im 381/720]
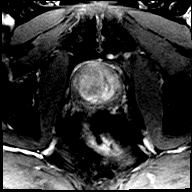
[im 423/720]
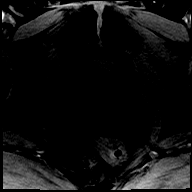
[im 466/720]
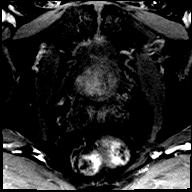
[im 508/720]
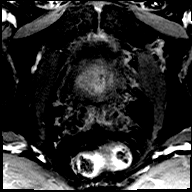
[im 550/720]
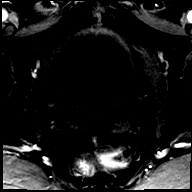
[im 593/720]
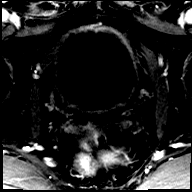
[im 635/720]
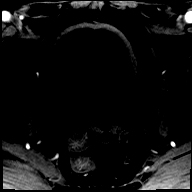
[im 677/720]
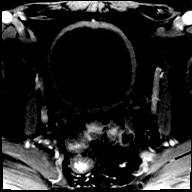
[im 720/720]
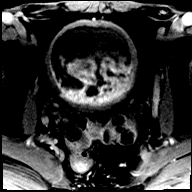

[Series 12: post t1_twist_tra_dyn-copy cent_sub · axial · 3.5mm · 0.83mm/px · z∈[-62,+19]mm · 17 of 684 slices shown]
[im 1/684]
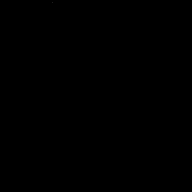
[im 43/684]
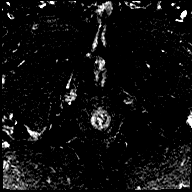
[im 86/684]
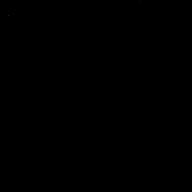
[im 129/684]
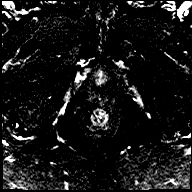
[im 171/684]
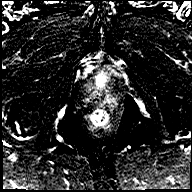
[im 214/684]
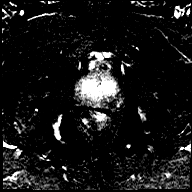
[im 257/684]
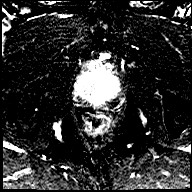
[im 299/684]
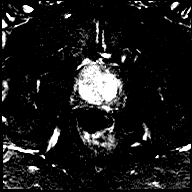
[im 342/684]
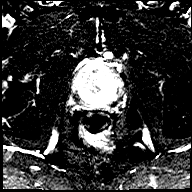
[im 385/684]
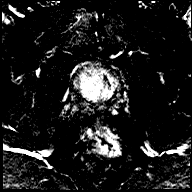
[im 427/684]
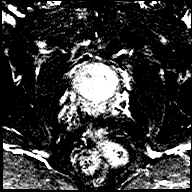
[im 470/684]
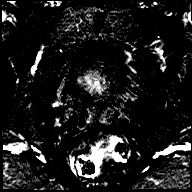
[im 513/684]
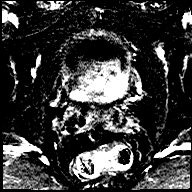
[im 555/684]
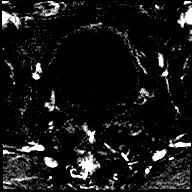
[im 598/684]
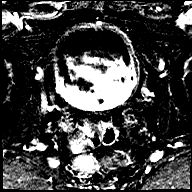
[im 641/684]
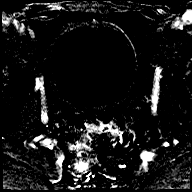
[im 684/684]
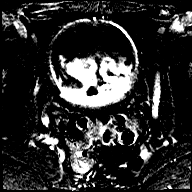

[Series 13: t1_vibe_dixon_tra_f · axial · 2.5mm · 0.91mm/px · z∈[-110,+87]mm · 2 of 80 slices shown]
[im 1/80]
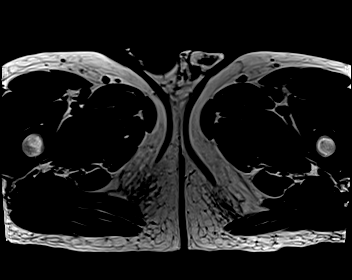
[im 80/80]
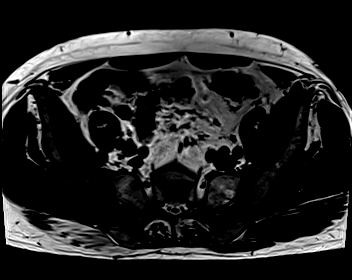

[Series 14: t1_vibe_dixon_tra_w · axial · 2.5mm · 0.91mm/px · z∈[-110,+87]mm · 2 of 80 slices shown]
[im 1/80]
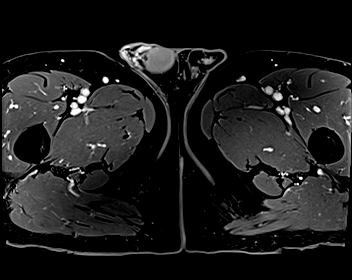
[im 80/80]
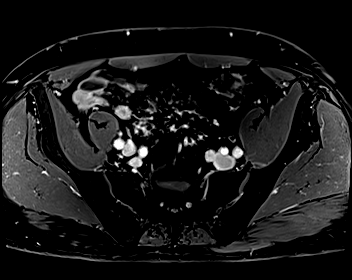

[48 of 48 positions shown; findings below may reference images not displayed]

FINDINGS: Prostate: No findings suspicious for high-grade prostate cancer in
the peripheral zone. Specifically, no focal low T2 lesion. No
restricted diffusion/low ADC. No early arterial enhancement.

Enlargement/nodularity of the central gland, suggesting BPH.

Lenticular, homogeneous low T2 signal at the left anterior apex of
the central gland (series 9/image 53), with nonspecific mild
restricted diffusion/low ADC (series [DATE]/image 18), without
convincing differential enhancement relative to background. Given
the T2 appearance, this is considered a PI-RADS 5 lesion.

Large, rounded, heterogeneous low T2 signal nodule in the right mid
central gland (series 9/image 46), with nonspecific mild restricted
diffusion/low ADC (series [DATE]/image 17), without convincing
differential enhancement relative to background. Given the T2
appearance, this is considered a PI-RADS 3 lesion.

Volume: 4.6 x 4.0 x 4.3 cm (calculated volume 40.0 mL).

Transcapsular spread: Left apical central gland lesion abuts but
does not transgress the anterior fibromuscular stroma.

Seminal vesicle involvement: Absent.

Neurovascular bundle involvement: Absent.

Pelvic adenopathy: Absent.

Bone metastasis: Absent.

Other findings: Extensive sigmoid diverticulosis, without evidence
of diverticulitis.
IMPRESSION: No findings suspicious for high-grade macroscopic prostate cancer in
the peripheral zone.

Two lesions in the central gland, as above, including a PI-RADS 5
lesion in the left anterior apex, raising the possibility of central
gland tumor. These lesions were marked in DynaCAD 3D software for
potential UroNAV biopsy.

No evidence of extracapsular extension, seminal vesicle invasion,
lymphadenopathy, or metastatic disease.

Calculated prostate volume 40 mL.

## 2020-12-14 MED ORDER — GADOBENATE DIMEGLUMINE 529 MG/ML IV SOLN
20.0000 mL | Freq: Once | INTRAVENOUS | Status: AC | PRN
  Administered 2020-12-14: 20 mL via INTRAVENOUS

## 2021-04-17 ENCOUNTER — Ambulatory Visit
Admission: RE | Admit: 2021-04-17 | Discharge: 2021-04-17 | Disposition: A | Discharge status: Home / Self Care | Payer: Medicare Other | Source: Ambulatory Visit | Attending: Radiation Oncology | Admitting: Radiation Oncology

## 2021-04-17 ENCOUNTER — Encounter: Payer: Self-pay | Admitting: Radiation Oncology

## 2021-04-17 ENCOUNTER — Other Ambulatory Visit: Payer: Self-pay

## 2021-04-17 VITALS — BP 120/75 | HR 66 | Temp 97.6°F | Resp 20 | Ht 74.0 in | Wt 209.4 lb

## 2021-04-17 DIAGNOSIS — Z7982 Long term (current) use of aspirin: Secondary | ICD-10-CM | POA: Diagnosis not present

## 2021-04-17 DIAGNOSIS — Z79899 Other long term (current) drug therapy: Secondary | ICD-10-CM | POA: Insufficient documentation

## 2021-04-17 DIAGNOSIS — C61 Malignant neoplasm of prostate: Secondary | ICD-10-CM | POA: Insufficient documentation

## 2021-04-17 NOTE — Progress Notes (Signed)
Radiation Oncology         [phone] ________________________________  Initial Outpatient Consultation  Name: Scott Flynn MRN: [id number]  Date: 04/17/2021  DOB: [birth year]  PY:[id number], Henderson Newcomer, MD  Remi Haggard, MD   REFERRING PHYSICIAN: Remi Haggard, MD  DIAGNOSIS: 70 y.o. 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    ICD-10-CM   1. Malignant neoplasm of prostate ([place])  C61       HISTORY OF PRESENT ILLNESS: Scott Flynn is a 70 y.o. male with a diagnosis of prostate cancer. He was previously seen by Dr. Jeffie Pollock in 2007 for an elevated PSA around 5. He underwent prostate biopsy in 06/2006 and again in 04/2007, both of which were negative for high-grade adenocarcinoma.  More recently, he was noted to have an elevated PSA of 9.77 by his primary care physician, Dr. Jonelle Sidle.  Accordingly, he was referred for evaluation in urology by Dr. Milford Cage on 11/23/20,  digital rectal examination was performed at that time revealing no nodules. He underwent prostate MRI on 12/14/20 showing two lesions in the central gland, including a PI-RADS 5 lesion in the left anterior apex and a PI-RADS 3 lesion in the right central gland.  There were no findings suspicious for high-grade macroscopic prostate cancer in the peripheral zone; no evidence of extracapsular extension, seminal vesicle invasion, lymphadenopathy, or metastatic disease.  The patient proceeded to MRI fusion biopsy of the prostate on 03/26/21.  The prostate volume measured 48.3 cc.  Out of 18 core biopsies, 9 were positive.  The maximum Gleason score was 3+4, and this was seen in 3 of 4 ROI MRI lesions samples from the PI-RADS 5 lesion on the left, 1 of 3 samples from the ROI lesion on the right and also in the left base lateral. Additionally, Gleason 3+3 was seen in the remaining 2 of 3 ROI MRI lesions samples from the PI-RADS 3 lesion on the right and also in the right  apex lateral, right mid lateral, right apex, and right base.  The patient reviewed the biopsy results with his urologist and he has kindly been referred today for discussion of potential radiation treatment options.   PREVIOUS RADIATION THERAPY: No  PAST MEDICAL HISTORY:  Past Medical History:  Diagnosis Date   Anxiety    Enlarged prostate       PAST SURGICAL HISTORY: Past Surgical History:  Procedure Laterality Date   ETHMOIDECTOMY N/A 07/21/2018   Procedure: TOTAL ETHMOIDECTOMY;  Surgeon: Rozetta Nunnery, MD;  Location: [facility];  Service: ENT;  Laterality: N/A;   KNEE ARTHROSCOPY     NASAL SEPTOPLASTY W/ TURBINOPLASTY N/A 07/21/2018   Procedure: NASAL SEPTOPLASTY WITH TURBINATE REDUCTION;  Surgeon: Rozetta Nunnery, MD;  Location: [facility] OR;  Service: ENT;  Laterality: N/A;   NASAL SINUS SURGERY N/A 07/21/2018   Procedure: ENDOSCOPIC SINUS SURGERY, MAXILLARY ENLARGEMENT;  Surgeon: Rozetta Nunnery, MD;  Location: [facility];  Service: ENT;  Laterality: N/A;   PROSTATE BIOPSY     SINUS ENDO WITH FUSION N/A 07/21/2018   Procedure: SINUS ENDO WITH FUSION;  Surgeon: Rozetta Nunnery, MD;  Location: [facility];  Service: ENT;  Laterality: N/A;    FAMILY HISTORY: History reviewed. No pertinent family history.  SOCIAL HISTORY:  Social History   Socioeconomic History   Marital status: Single    Spouse name: Not on file   Number of children: Not on file  Years of education: Not on file   Highest education level: Not on file  Occupational History   Not on file  Tobacco Use   Smoking status: Never   Smokeless tobacco: Never  Vaping Use   Vaping Use: Never used  Substance and Sexual Activity   Alcohol use: Never   Drug use: No   Sexual activity: Not on file  Other Topics Concern   Not on file  Social History Narrative   Not on file   Social Determinants of Health   Financial Resource Strain: Not on file  Food Insecurity: Not on file  Transportation Needs: Not on  file  Physical Activity: Not on file  Stress: Not on file  Social Connections: Not on file  Intimate Partner Violence: Not on file    ALLERGIES: Patient has no known allergies.  MEDICATIONS:  Current Outpatient Medications  Medication Sig Dispense Refill   aspirin 81 MG chewable tablet Chew 81 mg by mouth daily.     cycloSPORINE (RESTASIS) 0.05 % ophthalmic emulsion SMARTSIG:1 Drop(s) In Eye(s) Every 12 Hours     tamsulosin (FLOMAX) 0.4 MG CAPS capsule Take by mouth.     valACYclovir (VALTREX) 500 MG tablet Take by mouth.     ALPRAZolam (XANAX) 1 MG tablet Take 1 mg by mouth 3 (three) times daily.     fluticasone (FLONASE) 50 MCG/ACT nasal spray Place 2 sprays into both nostrils at bedtime.     tamsulosin (FLOMAX) 0.4 MG CAPS capsule Take 0.4 mg by mouth every evening.      No current facility-administered medications for this encounter.    REVIEW OF SYSTEMS:  On review of systems, the patient reports that he is doing well overall. He denies any chest pain, shortness of breath, cough, fevers, chills, night sweats, unintended weight changes. He denies any bowel disturbances, and denies abdominal pain, nausea or vomiting. He denies any new musculoskeletal or joint aches or pains.  He does have some persistent perineal discomfort that began in July 2022 and has not responded to antibiotic therapy for suspected prostatitis.  He has taken Aleve on occasion when the pain was significant and he does think that this was helpful.  His IPSS was 13, indicating mild-moderate urinary symptoms despite Flomax daily. His SHIM was 13, indicating he has moderate erectile dysfunction. A complete review of systems is obtained and is otherwise negative.    PHYSICAL EXAM:  Wt Readings from Last 3 Encounters:  04/17/21 209 lb 6.4 oz (95 kg)  07/21/18 230 lb (104.3 kg)   Temp Readings from Last 3 Encounters:  04/17/21 97.6 F (36.4 C) (Temporal)  07/21/18 97.7 F (36.5 C)  09/07/12 97.7 F (36.5 C)  (Oral)   BP Readings from Last 3 Encounters:  04/17/21 120/75  07/21/18 125/76  09/07/12 130/80   Pulse Readings from Last 3 Encounters:  04/17/21 66  07/21/18 90  09/07/12 88   Pain Assessment Pain Score: 1  Pain Loc: Abdomen/10  In general this is a well appearing Caucasian male in no acute distress. He's alert and oriented x4 and appropriate throughout the examination. Cardiopulmonary assessment is negative for acute distress, and he exhibits normal effort.     KPS = 90  100 - Normal; no complaints; no evidence of disease. 90   - Able to carry on normal activity; minor signs or symptoms of disease. 80   - Normal activity with effort; some signs or symptoms of disease. 37   - Cares for self; unable  to carry on normal activity or to do active work. 60   - Requires occasional assistance, but is able to care for most of his personal needs. 50   - Requires considerable assistance and frequent medical care. 51   - Disabled; requires special care and assistance. 37   - Severely disabled; hospital admission is indicated although death not imminent. 4   - Very sick; hospital admission necessary; active supportive treatment necessary. 10   - Moribund; fatal processes progressing rapidly. 0     - Dead  Karnofsky DA, [place], Craver LS and Burchenal [facility] [phone]) The use of the nitrogen mustards in the palliative treatment of carcinoma: with particular reference to bronchogenic carcinoma Cancer 1 634-56  LABORATORY DATA:  Lab Results  Component Value Date   WBC 6.5 07/21/2018   HGB 14.9 07/21/2018   HCT 44.1 07/21/2018   MCV 94.2 07/21/2018   PLT 194 07/21/2018   Lab Results  Component Value Date   NA 138 07/21/2018   K 3.9 07/21/2018   CL 109 07/21/2018   CO2 21 (L) 07/21/2018   No results found for: ALT, AST, GGT, ALKPHOS, BILITOT   RADIOGRAPHY: No results found.    IMPRESSION/PLAN: 1. 70 y.o. gentleman with Stage T1c adenocarcinoma of the prostate with Gleason Score  of 3+4, and PSA of 9.77. We discussed the patient's workup and outlined the nature of prostate cancer in this setting. The patient's T stage, Gleason's score, and PSA put him into the favorable intermediate risk group. Accordingly, he is eligible for a variety of potential treatment options including brachytherapy, 5.5 weeks of external radiation, or prostatectomy. We discussed the available radiation techniques, and focused on the details and logistics of delivery. We discussed and outlined the risks, benefits, short and long-term effects associated with radiotherapy and compared and contrasted these with prostatectomy. We discussed the role of SpaceOAR gel in reducing the rectal toxicity associated with radiotherapy. He appears to have a good understanding of his disease and our treatment recommendations which are of curative intent.  He was encouraged to ask questions that were answered to his stated satisfaction.  At the conclusion of our conversation, the patient is interested in moving forward with brachytherapy and use of SpaceOAR gel to reduce rectal toxicity from radiotherapy.  We will share our discussion with Dr. Milford Cage and move forward with scheduling his CT [facility] planning appointment in the near future.  The patient met briefly with Romie Jumper in our office who will be working closely with him to coordinate OR scheduling and pre and post procedure appointments.  We will contact the pharmaceutical rep to ensure that [employer] is available at the time of procedure.  We enjoyed meeting him today and look forward to continuing to participate in his care.   We personally spent 70 minutes in this encounter including chart review, reviewing radiological studies, meeting face-to-face with the patient, entering orders and completing documentation.    Nicholos Johns, PA-C    Tyler Pita, MD  [facility] Direct Dial: [phone]  Fax: [phone] [email or web address]   Skype  [email or web address]   This document serves as a record of services personally performed by Tyler Pita, MD and Freeman Caldron, PA-C. It was created on their behalf by Wilburn Mylar, a trained medical scribe. The creation of this record is based on the scribe's personal observations and the provider's statements to them. This document has been checked and approved by the attending provider.

## 2021-04-17 NOTE — Progress Notes (Signed)
GU Location of Tumor / Histology: Prostate Ca  If Prostate Cancer, Gleason Score is (4 + 3) and PSA is (9.77 as of 5/22)  Biopsies  Dr. Milford Cage        Past/Anticipated interventions by urology, if any:   Past/Anticipated interventions by medical oncology, if any:   Weight changes, if any:  No stable.  IPSS:  13 SHIM:  13  Bowel/Bladder complaints, if any:  No bowel or bladder issues.  Nausea/Vomiting, if any: No  Pain issues, if any:  1/10 to treatment site.  SAFETY ISSUES: Prior radiation?  No Pacemaker/ICD?  No Possible current pregnancy?  Male Is the patient on methotrexate? No  Current Complaints / other details:  Need more information on treatment.

## 2021-04-19 ENCOUNTER — Telehealth: Payer: Self-pay | Admitting: *Deleted

## 2021-04-19 NOTE — Telephone Encounter (Signed)
CALLED PATIENT TO UPDATE, SPOKE WITH PATIENT 

## 2021-04-23 ENCOUNTER — Other Ambulatory Visit: Payer: Self-pay | Admitting: Urology

## 2021-04-24 ENCOUNTER — Ambulatory Visit: Payer: Medicare Other | Admitting: Radiation Oncology

## 2021-04-24 ENCOUNTER — Ambulatory Visit: Payer: Medicare Other

## 2021-05-09 ENCOUNTER — Telehealth: Payer: Self-pay | Admitting: *Deleted

## 2021-05-09 NOTE — Telephone Encounter (Signed)
CALLED PATIENT TO REMIND OF PRE-SEED APPTS. FOR 05-10-21, SPOKE WITH PATIENT AND HE IS AWARE OF THESE APPTS.

## 2021-05-09 NOTE — Progress Notes (Signed)
  Radiation Oncology         [phone] ________________________________  Name: Scott Flynn MRN: [id number]  Date: 05/10/2021  DOB: 1951/02/11  SIMULATION AND TREATMENT PLANNING NOTE PUBIC ARCH STUDY  [id number], Henderson Newcomer, MD  Remi Haggard, MD  DIAGNOSIS:   70 y.o. 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  Oncology History  Malignant neoplasm of prostate ([place])  03/26/2021 Cancer Staging   Staging form: Prostate, AJCC 8th Edition - Clinical stage from 03/26/2021: Stage IIB (cT1c, cN0, cM0, PSA: 9.8, Grade Group: 2) - Signed by Freeman Caldron, PA-C on 04/17/2021 Histopathologic type: Adenocarcinoma, NOS Stage prefix: Initial diagnosis Prostate specific antigen (PSA) range: Less than 10 Gleason primary pattern: 3 Gleason secondary pattern: 4 Gleason score: 7 Histologic grading system: 5 grade system    04/17/2021 Initial Diagnosis   Malignant neoplasm of prostate ([place])       ICD-10-CM   1. Malignant neoplasm of prostate ([place])  C61       COMPLEX SIMULATION:  The patient presented today for evaluation for possible prostate seed implant. He was brought to the radiation planning suite and placed supine on the CT couch. A 3-dimensional image study set was obtained in upload to the planning computer. There, on each axial slice, I contoured the prostate gland. Then, using three-dimensional radiation planning tools I reconstructed the prostate in view of the structures from the transperineal needle pathway to assess for possible pubic arch interference. In doing so, I did not appreciate any pubic arch interference. Also, the patient's prostate volume was estimated based on the drawn structure. The volume was 49 cc.  Given the pubic arch appearance and prostate volume, patient remains a good candidate to proceed with prostate seed implant. Today, he freely provided informed written consent to proceed.    PLAN: The  patient will undergo prostate seed implant.   ________________________________  Sheral Apley. Tammi Klippel, M.D.

## 2021-05-10 ENCOUNTER — Ambulatory Visit
Admission: RE | Admit: 2021-05-10 | Discharge: 2021-05-10 | Disposition: A | Discharge status: Home / Self Care | Payer: Medicare Other | Source: Ambulatory Visit | Attending: Urology | Admitting: Urology

## 2021-05-10 ENCOUNTER — Ambulatory Visit
Admission: RE | Admit: 2021-05-10 | Discharge: 2021-05-10 | Disposition: A | Discharge status: Home / Self Care | Payer: Medicare Other | Source: Ambulatory Visit | Attending: Radiation Oncology | Admitting: Radiation Oncology

## 2021-05-10 ENCOUNTER — Other Ambulatory Visit: Payer: Self-pay

## 2021-05-10 ENCOUNTER — Telehealth: Payer: Self-pay | Admitting: *Deleted

## 2021-05-10 ENCOUNTER — Encounter: Payer: Self-pay | Admitting: General Practice

## 2021-05-10 ENCOUNTER — Encounter (HOSPITAL_COMMUNITY)
Admission: RE | Admit: 2021-05-10 | Discharge: 2021-05-10 | Disposition: A | Discharge status: Home / Self Care | Payer: Medicare Other | Source: Ambulatory Visit | Attending: Urology | Admitting: Urology

## 2021-05-10 ENCOUNTER — Ambulatory Visit (HOSPITAL_COMMUNITY)
Admission: RE | Admit: 2021-05-10 | Discharge: 2021-05-10 | Disposition: A | Discharge status: Home / Self Care | Payer: Medicare Other | Source: Ambulatory Visit | Attending: Urology | Admitting: Urology

## 2021-05-10 ENCOUNTER — Encounter: Payer: Self-pay | Admitting: Urology

## 2021-05-10 DIAGNOSIS — C61 Malignant neoplasm of prostate: Secondary | ICD-10-CM

## 2021-05-10 DIAGNOSIS — Z01818 Encounter for other preprocedural examination: Secondary | ICD-10-CM | POA: Diagnosis present

## 2021-05-10 NOTE — Progress Notes (Signed)
Patient reports prostate pressure, pain 2/10 that increases upon sitting. No other symptoms reported at this time.  Meaningful use complete.  Currently on Flomax 0.4mg  as directed. Urology follow-up scheduled for November 28th, 2023 -per [facility].  There were no vitals taken for this visit.

## 2021-05-10 NOTE — Telephone Encounter (Signed)
Called patient to inform of implant date, spoke with patient and he is aware of this date. 

## 2021-05-10 NOTE — Progress Notes (Signed)
[facility] Psychosocial Distress Screening Clinical Social Work  Clinical Social Work was referred by distress screening protocol.  The patient scored a 6 on the Psychosocial Distress Thermometer which indicates moderate distress. Clinical Social Worker contacted patient by phone to assess for distress and other psychosocial needs. He is not in need of any support/resources at this time.  He declined information about support groups or other programming opportunities at [employer] - he was encouraged to reach out if needs arise in the future.    ONCBCN DISTRESS SCREENING 05/10/2021  Screening Type   Distress experienced in past week (1-10) 6  Emotional problem type Nervousness/Anxiety;Adjusting to illness  Spiritual/Religous concerns type   Information Concerns Type   Physical Problem type     Clinical Social Worker follow up needed: No.  If yes, follow up plan:  Beverely Pace, [place], LCSW Clinical Social Worker Phone:  [phone]

## 2021-06-04 ENCOUNTER — Encounter (HOSPITAL_COMMUNITY)
Admission: RE | Admit: 2021-06-04 | Discharge: 2021-06-04 | Disposition: A | Discharge status: Home / Self Care | Payer: Medicare Other | Source: Ambulatory Visit | Attending: Urology | Admitting: Urology

## 2021-06-04 DIAGNOSIS — Z01812 Encounter for preprocedural laboratory examination: Secondary | ICD-10-CM | POA: Diagnosis present

## 2021-06-04 DIAGNOSIS — C61 Malignant neoplasm of prostate: Secondary | ICD-10-CM | POA: Diagnosis not present

## 2021-06-04 LAB — COMPREHENSIVE METABOLIC PANEL
ALT: 20 U/L (ref 0–44)
AST: 22 U/L (ref 15–41)
Albumin: 4.1 g/dL (ref 3.5–5.0)
Alkaline Phosphatase: 58 U/L (ref 38–126)
Anion gap: 7 (ref 5–15)
BUN: 9 mg/dL (ref 8–23)
CO2: 22 mmol/L (ref 22–32)
Calcium: 8.6 mg/dL — ABNORMAL LOW (ref 8.9–10.3)
Chloride: 109 mmol/L (ref 98–111)
Creatinine, Ser: 0.66 mg/dL (ref 0.61–1.24)
GFR, Estimated: >60 mL/min (ref >60)
Glucose, Bld: 114 mg/dL — ABNORMAL HIGH (ref 70–99)
Potassium: 4.1 mmol/L (ref 3.5–5.1)
Sodium: 138 mmol/L (ref 135–145)
Total Bilirubin: 0.6 mg/dL (ref 0.3–1.2)
Total Protein: 6.9 g/dL (ref 6.5–8.1)

## 2021-06-04 LAB — CBC
HCT: 42 % (ref 39.0–52.0)
Hemoglobin: 14.5 g/dL (ref 13.0–17.0)
MCH: 32.2 pg (ref 26.0–34.0)
MCHC: 34.5 g/dL (ref 30.0–36.0)
MCV: 93.3 fL (ref 80.0–100.0)
Platelets: 173 10*3/uL (ref 150–400)
RBC: 4.5 MIL/uL (ref 4.22–5.81)
RDW: 13.2 % (ref 11.5–15.5)
WBC: 6 10*3/uL (ref 4.0–10.5)
nRBC: 0 % (ref 0.0–0.2)

## 2021-06-04 LAB — PROTIME-INR
INR: 1 (ref 0.8–1.2)
Prothrombin Time: 13.3 seconds (ref 11.4–15.2)

## 2021-06-04 LAB — APTT: aPTT: 32 seconds (ref 24–36)

## 2021-06-06 ENCOUNTER — Other Ambulatory Visit: Payer: Self-pay

## 2021-06-06 ENCOUNTER — Encounter (HOSPITAL_BASED_OUTPATIENT_CLINIC_OR_DEPARTMENT_OTHER): Payer: Self-pay | Admitting: Urology

## 2021-06-06 NOTE — Progress Notes (Addendum)
Spoke w/ via phone for pre-op interview---pt Lab needs dos----none               Lab results------chest xray 05-10-2021 chart/epic, ekg 05-10-2021 chart/epic COVID test -----patient states asymptomatic no test needed Arrive at -------530 am 06-12-2021 NPO after MN NO Solid Food.  Clear liquids from MN until---430 am Med rec completed Medications to take morning of surgery -----xanax, xiidra  Diabetic medication -----n/a Patient instructed no nail polish to be worn day of surgery Patient instructed to bring photo id and insurance card day of surgery Patient aware to have Driver (ride ) / caregiver    for 24 hours after surgery wife sandra scott will stay  Patient Special Instructions -----fleets enema am of surgery Pre-Op special Istructions -----none Patient verbalized understanding of instructions that were given at this phone interview. Patient denies shortness of breath, chest pain, fever, cough at this phone interview.   Pt stated he stopped 81 mg aspirin on 05-23-2021 on his own for 06-12-2021 surgery.

## 2021-06-08 ENCOUNTER — Inpatient Hospital Stay (HOSPITAL_COMMUNITY): Admission: RE | Admit: 2021-06-08 | Payer: Medicare Other | Source: Ambulatory Visit

## 2021-06-11 ENCOUNTER — Telehealth: Payer: Self-pay | Admitting: *Deleted

## 2021-06-11 NOTE — Anesthesia Preprocedure Evaluation (Addendum)
Anesthesia Evaluation  Patient identified by MRN, date of birth, ID band Patient awake  General Assessment Comment:Hx noted Dr. Nyoka Cowden  Reviewed: Allergy & Precautions, NPO status , Patient's Chart, lab work & pertinent test results  Airway Mallampati: II  TM Distance: >3 FB     Dental   Pulmonary neg pulmonary ROS,    breath sounds clear to auscultation       Cardiovascular negative cardio ROS   Rhythm:Regular Rate:Normal     Neuro/Psych    GI/Hepatic negative GI ROS, Neg liver ROS,   Endo/Other    Renal/GU negative Renal ROS     Musculoskeletal  (+) Arthritis ,   Abdominal   Peds  Hematology   Anesthesia Other Findings   Reproductive/Obstetrics                             Anesthesia Physical Anesthesia Plan  ASA: 3  Anesthesia Plan: General   Post-op Pain Management:    Induction: Intravenous  PONV Risk Score and Plan: 2 and Treatment may vary due to age or medical condition and Ondansetron  Airway Management Planned: Oral ETT  Additional Equipment:   Intra-op Plan:   Post-operative Plan: Extubation in OR  Informed Consent: I have reviewed the patients History and Physical, chart, labs and discussed the procedure including the risks, benefits and alternatives for the proposed anesthesia with the patient or authorized representative who has indicated his/her understanding and acceptance.     Dental advisory given  Plan Discussed with: CRNA and Anesthesiologist  Anesthesia Plan Comments:         Anesthesia Quick Evaluation

## 2021-06-11 NOTE — Telephone Encounter (Signed)
CALLED PATIENT TO REMIND OF PROCEDURE FOR 06-12-21, LVM FOR A RETURN CALL

## 2021-06-11 NOTE — H&P (Signed)
Patient is a 70 year old white male previously seen by Dr. Jeffie Pollock for evaluation of BPH and elevated PSA. Has undergone 2-prostate biopsies 1st 1 in December of 2007 which showed ASAP at the left apically area. Had repeat biopsy on 04/20/2007 which was negative. Had recent PSA at PCP office which was 9.77. The he has history of BPH managed with tamsulosin 0.4 mg daily. From urinary perspective patient is doing well as long as he takes the tamsulosin regularly.  Micro urinalysis is clear on urine spun sediment  -12/22/20-patient with history of BPH and elevated PSA with negative biopsies x2 last 1 in 2008 as above. Had rising PSA up to 9 point 7 and MRI of the prostate was ordered. This is reviewed with results as below. Showed evidence of a Pi Rad 5 lesion in the central part of the gland. No high-grade lesions in the peripheral portion of the gland. Volume measured at 40 cc  CLINICAL DATA: Elevated PSA 9.77, evaluate for prostate cancer   EXAM:  MR PROSTATE WITHOUT AND WITH CONTRAST   TECHNIQUE:  Multiplanar multisequence MRI images were obtained of the pelvis  centered about the prostate. Pre and post contrast images were  obtained.   CONTRAST: 38mL MULTIHANCE GADOBENATE DIMEGLUMINE 529 MG/ML IV SOLN   COMPARISON: None.   FINDINGS:  Prostate: No findings suspicious for high-grade prostate cancer in  the peripheral zone. Specifically, no focal low T2 lesion. No  restricted diffusion/low ADC. No early arterial enhancement.   Enlargement/nodularity of the central gland, suggesting BPH.   Lenticular, homogeneous low T2 signal at the left anterior apex of  the central gland (series 9/image 53), with nonspecific mild  restricted diffusion/low ADC (series 6-7/image 18), without  convincing differential enhancement relative to background. Given  the T2 appearance, this is considered a PI-RADS 5 lesion.   Large, rounded, heterogeneous low T2 signal nodule in the right mid  central gland (series  9/image 46), with nonspecific mild restricted  diffusion/low ADC (series 6-7/image 17), without convincing  differential enhancement relative to background. Given the T2  appearance, this is considered a PI-RADS 3 lesion.   Volume: 4.6 x 4.0 x 4.3 cm (calculated volume 40.0 mL).   Transcapsular spread: Left apical central gland lesion abuts but  does not transgress the anterior fibromuscular stroma.   Seminal vesicle involvement: Absent.   Neurovascular bundle involvement: Absent.   Pelvic adenopathy: Absent.   Bone metastasis: Absent.   Other findings: Extensive sigmoid diverticulosis, without evidence  of diverticulitis.   IMPRESSION:  No findings suspicious for high-grade macroscopic prostate cancer in  the peripheral zone.   Two lesions in the central gland, as above, including a PI-RADS 5  lesion in the left anterior apex, raising the possibility of central  gland tumor. These lesions were marked in [place] for  potential UroNAV biopsy.   No evidence of extracapsular extension, seminal vesicle invasion,  lymphadenopathy, or metastatic disease.   Calculated prostate volume 40 mL.    Electronically Signed  By: Julian Hy M.D.  On: 12/15/2020 13:05  Discussed MRI results. Based on potential high-grade lesion in the central part of the gland her recommended fusion prostate biopsy. Risks and benefits the procedure were discussed in detail. Patient agreeable will schedule accordingly in the near future.  Prostate biopsy consent: The patient and I discussed the risks, benefits and alternatives to prostate biopsy. The risks of prostate biopsy can include but are not limited to the following: Blood in the urine, stool or ejaculate  for several days to months potentially severe infection(sepsis), in up to 4% of patients, discomfort or pain and urinary retention. Postprocedure lead the patient has been instructed to go to the emergency room or call the clinic if he  experiences any flu-like symptoms including generalized malaise, fevers, chills, nausea or vomiting. We discussed the small risk of erectile dysfunction long-term. He he is asked to stop all anticoagulation medications 1 week prior to the biopsy. We also talked about the pathology processing taking about 1 week and I will call the patient with the pathology results at that point. We have provided the patient with an instruction sheet and preprocedural antibiotics. I have asked him to pick up a Fleet's enema over-the-counter and use approximately 1 hour prior to his procedure. After complete discussion he has consented to the procedure. He will come back at the next available appointment time for his biopsy.   -03/19/21-patient with history of elevated PSA and abnormal MRI of the prostate. Was scheduled for prostate biopsy but the patient canceled as he is having issues with "prostatitis. Main issue he was experiencing was perineal pain in absence of any significant urinary symptomatology. Was seen at urgent care initially placed on Cipro for about 18 days by his report. Was subsequently seen by urologist in [facility] who repeated a proximally 1 month of doxycycline for him and he is finishing up last dose tomorrow. Currently minimally symptomatic.  Micro urinalysis is clear on urine spun sediment  -04/09/21-patient with elevated PSA and abnormal MRI of the prostate. 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 He had 2 fusion biopsy lesions all 6 were positive for Gleason 3+4 equals 7 disease. Patient also had some Gleason 6 disease. Here to discuss next steps of management here no post procedural issues.  We discussed treatment options for prostate cancer and I have given him prostate cancer information book. I am going to make referral to see Dr. Tammi Klippel at Radiation Oncology and then will put our heads back  together make decision forward regarding either surgical management versus radiation management of his prostate cancer. I did not recommend surveillance based on pathology. We also discussed possibility of robotic prostatectomy in detail today and he is considering this as well. He is continuing to be concerned about chronic perineal pain and I told him I did not think this was likely related to the cancer and I could not tell him that treatment with either surgical removal or radiation therapy would cause the perineal discomfort to alleviate. He is going to meet with Dr. Tammi Klippel 1st and then get back with me about what management he may want to proceed with. He is also from the [facility] area and is contemplating getting his definitive management there due to transportation issues. He will let us know if he wants to transfer his care to [facility]. 45 minutes consultation time today direct face-to-face.   -06/04/21-with recent diagnosis of adenocarcinoma the prostate on 03/25/2021. Patient has subsequently been seen by radiation oncology and after meeting with Dr. Tammi Klippel decided to pursue interstitial brachytherapy as definitive management for his prostate cancer. Here now for preoperative evaluation for upcoming interstitial brachytherapy and SpaceOAR gel implant.     ALLERGIES: No Allergies    MEDICATIONS: Tamsulosin Hcl  Aspirin 81 mg tablet,chewable Oral  Levofloxacin 750 mg tablet Take 1 tab po 1 hour prior to procedure  Xanax 2 mg tablet Oral  GU PSH: Prostate Needle Biopsy - 03/26/2021       PSH Notes: Knee Surgery, Hand Surgery   NON-GU PSH: Knee Arthroscopy/surgery Surgical Pathology, Gross And Microscopic Examination For Prostate Needle - 03/26/2021     GU PMH: Prostate Cancer - 04/09/2021 Elevated PSA - 03/26/2021, - 03/19/2021, - 12/22/2020, - 11/23/2020, Elevated prostate specific antigen (PSA), - 2014 BPH w/LUTS - 03/19/2021, - 12/22/2020, - 11/23/2020 Weak Urinary Stream -  03/19/2021, - 12/22/2020, - 11/23/2020      PMH Notes:  2006-07-03 15:33:18 - Note: Anxiety  1898-07-08 00:00:00 - Note: Normal Routine History And Physical Adult  2006-07-03 15:33:18 - Note: Arthritis   NON-GU PMH: Personal history of other mental and behavioral disorders, History of depression - 2014 Anxiety Arthritis Depression    FAMILY HISTORY: 3 daughters - Other father deceased - Other   SOCIAL HISTORY: Marital Status: Unknown Ethnicity: Not Hispanic Or Latino; Race: White Current Smoking Status: Patient has never smoked.   Tobacco Use Assessment Completed: Used Tobacco in last 30 days? Does not use smokeless tobacco. Does not drink caffeine.     Notes: Marital History - Single, Occupation:, Tobacco Use, Alcohol Use, Caffeine Use   REVIEW OF SYSTEMS:    GU Review Male:   Patient denies frequent urination, hard to postpone urination, burning/ pain with urination, get up at night to urinate, leakage of urine, stream starts and stops, trouble starting your stream, have to strain to urinate , erection problems, and penile pain.  Gastrointestinal (Upper):   Patient denies nausea, vomiting, and indigestion/ heartburn.  Gastrointestinal (Lower):   Patient denies diarrhea and constipation.  Constitutional:   Patient denies fever, night sweats, weight loss, and fatigue.  Skin:   Patient denies skin rash/ lesion and itching.  Eyes:   Patient denies blurred vision and double vision.  Ears/ Nose/ Throat:   Patient denies sore throat and sinus problems.  Hematologic/Lymphatic:   Patient denies swollen glands and easy bruising.  Cardiovascular:   Patient denies leg swelling and chest pains.  Respiratory:   Patient denies cough and shortness of breath.  Endocrine:   Patient denies excessive thirst.  Musculoskeletal:   Patient denies back pain and joint pain.  Neurological:   Patient denies headaches and dizziness.  Psychologic:   Patient denies anxiety and depression.   VITAL SIGNS:  None   MULTI-SYSTEM PHYSICAL EXAMINATION:    Constitutional: Well-nourished. No physical deformities. Normally developed. Good grooming.  Neck: Neck symmetrical, not swollen. Normal tracheal position.  Respiratory: No labored breathing, no use of accessory muscles.   Cardiovascular: Normal temperature, normal extremity pulses, no swelling, no varicosities.  Lymphatic: No enlargement of neck, axillae, groin.  Skin: No paleness, no jaundice, no cyanosis. No lesion, no ulcer, no rash.  Neurologic / Psychiatric: Oriented to time, oriented to place, oriented to person. No depression, no anxiety, no agitation.  Eyes: Normal conjunctivae. Normal eyelids.  Ears, Nose, Mouth, and Throat: Left ear no scars, no lesions, no masses. Right ear no scars, no lesions, no masses. Nose no scars, no lesions, no masses. Normal hearing. Normal lips.  Musculoskeletal: Normal gait and station of head and neck.     Complexity of Data:  Source Of History:  Patient  Records Review:   Previous Doctor Records, Previous Hospital Records, Previous Patient Records  Urine Test Review:   Urinalysis   03/02/07  PSA  Total PSA 5.01   Free PSA 0.33   % Free PSA 6.6     PROCEDURES: None  ASSESSMENT:      ICD-10 Details  1 GU:   Prostate Cancer - E31 Acute, Complicated Injury   PLAN:           Document Letter(s):  Created for Patient: Clinical Summary         Notes:   We discussed the procedure of interstitial brachytherapy in detail including associated risks and benefits. These included but were not limited to hematuria, pain, dysuria, possible risk of postoperative urinary retention requiring Foley catheter. Side effects from radiation including urgency frequency hematuria or blood per rectum. Patient aware of risks desires to proceed and scheduled accordingly on 06/12/21.        Next Appointment:      Next Appointment: 06/12/2021 07:30 AM    Appointment Type: Surgery     Location: [facility] [phone]    Provider: Harold Barban, M.D.    Reason for Visit: NE/OP BRACHYTHERAPY AND SPACE OAR

## 2021-06-12 ENCOUNTER — Ambulatory Visit (HOSPITAL_COMMUNITY): Payer: Medicare Other

## 2021-06-12 ENCOUNTER — Ambulatory Visit (HOSPITAL_BASED_OUTPATIENT_CLINIC_OR_DEPARTMENT_OTHER)
Admission: RE | Admit: 2021-06-12 | Discharge: 2021-06-12 | Disposition: A | Discharge status: Home / Self Care | Payer: Medicare Other | Attending: Urology | Admitting: Urology

## 2021-06-12 ENCOUNTER — Encounter (HOSPITAL_BASED_OUTPATIENT_CLINIC_OR_DEPARTMENT_OTHER)
Admission: RE | Disposition: A | Discharge status: Home / Self Care | Payer: Self-pay | Source: Home / Self Care | Attending: Urology

## 2021-06-12 ENCOUNTER — Other Ambulatory Visit: Payer: Self-pay

## 2021-06-12 ENCOUNTER — Ambulatory Visit (HOSPITAL_BASED_OUTPATIENT_CLINIC_OR_DEPARTMENT_OTHER): Payer: Medicare Other | Admitting: Anesthesiology

## 2021-06-12 ENCOUNTER — Encounter (HOSPITAL_BASED_OUTPATIENT_CLINIC_OR_DEPARTMENT_OTHER): Payer: Self-pay | Admitting: Urology

## 2021-06-12 DIAGNOSIS — C61 Malignant neoplasm of prostate: Secondary | ICD-10-CM | POA: Diagnosis present

## 2021-06-12 HISTORY — DX: Malignant (primary) neoplasm, unspecified: C80.1

## 2021-06-12 HISTORY — DX: Unspecified osteoarthritis, unspecified site: M19.90

## 2021-06-12 HISTORY — PX: RADIOACTIVE SEED IMPLANT: SHX5150

## 2021-06-12 HISTORY — PX: SPACE OAR INSTILLATION: SHX6769

## 2021-06-12 HISTORY — PX: CYSTOSCOPY: SHX5120

## 2021-06-12 SURGERY — INSERTION, RADIATION SOURCE, PROSTATE
Anesthesia: General | Site: Rectum

## 2021-06-12 MED ORDER — FENTANYL CITRATE (PF) 100 MCG/2ML IJ SOLN
25.0000 ug | INTRAMUSCULAR | Status: DC | PRN
  Administered 2021-06-12 (×2): 50 ug via INTRAVENOUS

## 2021-06-12 MED ORDER — TRAMADOL HCL 50 MG PO TABS: 50.0000 mg | ORAL_TABLET | Freq: Four times a day (QID) | ORAL | 0 refills | Status: AC | PRN

## 2021-06-12 MED ORDER — FLEET ENEMA 7-19 GM/118ML RE ENEM: 1.0000 | ENEMA | Freq: Once | RECTAL | Status: DC

## 2021-06-12 MED ORDER — IOHEXOL 300 MG/ML  SOLN
INTRAMUSCULAR | Status: DC | PRN
  Administered 2021-06-12: 7 mL

## 2021-06-12 MED ORDER — MIDAZOLAM HCL 5 MG/5ML IJ SOLN
INTRAMUSCULAR | Status: DC | PRN
  Administered 2021-06-12: 1 mg via INTRAVENOUS

## 2021-06-12 MED ORDER — CIPROFLOXACIN IN D5W 400 MG/200ML IV SOLN
400.0000 mg | INTRAVENOUS | Status: AC
  Administered 2021-06-12: 400 mg via INTRAVENOUS

## 2021-06-12 MED ORDER — KETOROLAC TROMETHAMINE 15 MG/ML IJ SOLN
INTRAMUSCULAR | Status: DC | PRN
  Administered 2021-06-12: 15 mg via INTRAVENOUS

## 2021-06-12 MED ORDER — LACTATED RINGERS IV SOLN: INTRAVENOUS | Status: DC

## 2021-06-12 MED ORDER — ONDANSETRON HCL 4 MG/2ML IJ SOLN
INTRAMUSCULAR | Status: DC | PRN
  Administered 2021-06-12: 4 mg via INTRAVENOUS

## 2021-06-12 MED ORDER — DEXAMETHASONE SODIUM PHOSPHATE 10 MG/ML IJ SOLN
INTRAMUSCULAR | Status: DC | PRN
Start: 2021-06-12 — End: 2021-06-12
  Administered 2021-06-12: 5 mg via INTRAVENOUS

## 2021-06-12 MED ORDER — STERILE WATER FOR IRRIGATION IR SOLN
Status: DC | PRN
  Administered 2021-06-12: 500 mL

## 2021-06-12 MED ORDER — GLYCOPYRROLATE 0.2 MG/ML IJ SOLN
INTRAMUSCULAR | Status: DC | PRN
  Administered 2021-06-12: .2 mg via INTRAVENOUS

## 2021-06-12 MED ORDER — ROCURONIUM BROMIDE 10 MG/ML (PF) SYRINGE
PREFILLED_SYRINGE | INTRAVENOUS | Status: DC | PRN
  Administered 2021-06-12: 50 mg via INTRAVENOUS

## 2021-06-12 MED ORDER — EPHEDRINE SULFATE 50 MG/ML IJ SOLN
INTRAMUSCULAR | Status: DC | PRN
  Administered 2021-06-12 (×2): 10 mg via INTRAVENOUS

## 2021-06-12 MED ORDER — CIPROFLOXACIN IN D5W 400 MG/200ML IV SOLN
INTRAVENOUS | Status: AC
  Filled 2021-06-12: qty 200

## 2021-06-12 MED ORDER — SODIUM CHLORIDE FLUSH 0.9 % IV SOLN
INTRAVENOUS | Status: DC | PRN
  Administered 2021-06-12: 10 mL

## 2021-06-12 MED ORDER — SODIUM CHLORIDE 0.9 % IR SOLN
Status: DC | PRN
  Administered 2021-06-12: 1000 mL

## 2021-06-12 MED ORDER — FENTANYL CITRATE (PF) 100 MCG/2ML IJ SOLN
INTRAMUSCULAR | Status: DC | PRN
  Administered 2021-06-12: 50 ug via INTRAVENOUS
  Administered 2021-06-12: 100 ug via INTRAVENOUS

## 2021-06-12 MED ORDER — LIDOCAINE 2% (20 MG/ML) 5 ML SYRINGE
INTRAMUSCULAR | Status: DC | PRN
Start: 2021-06-12 — End: 2021-06-12
  Administered 2021-06-12: 60 mg via INTRAVENOUS

## 2021-06-12 MED ORDER — FENTANYL CITRATE (PF) 100 MCG/2ML IJ SOLN
INTRAMUSCULAR | Status: AC
  Filled 2021-06-12: qty 2

## 2021-06-12 MED ORDER — SUGAMMADEX SODIUM 200 MG/2ML IV SOLN
INTRAVENOUS | Status: DC | PRN
  Administered 2021-06-12: 200 mg via INTRAVENOUS

## 2021-06-12 MED ORDER — PROPOFOL 10 MG/ML IV BOLUS
INTRAVENOUS | Status: DC | PRN
  Administered 2021-06-12: 150 mg via INTRAVENOUS

## 2021-06-12 MED ORDER — PHENYLEPHRINE 40 MCG/ML (10ML) SYRINGE FOR IV PUSH (FOR BLOOD PRESSURE SUPPORT)
PREFILLED_SYRINGE | INTRAVENOUS | Status: DC | PRN
  Administered 2021-06-12 (×2): 80 ug via INTRAVENOUS
  Administered 2021-06-12: 120 ug via INTRAVENOUS

## 2021-06-12 SURGICAL SUPPLY — 44 items
BAG DRN RND TRDRP ANRFLXCHMBR (UROLOGICAL SUPPLIES) ×3
BAG URINE DRAIN 2000ML AR STRL (UROLOGICAL SUPPLIES) ×4 IMPLANT
BLADE CLIPPER SENSICLIP SURGIC (BLADE) ×4 IMPLANT
BRACHYTHERAPY QUICKLINK ×236 IMPLANT
CATH FOLEY 2WAY SLVR  5CC 16FR (CATHETERS) ×4
CATH FOLEY 2WAY SLVR 5CC 16FR (CATHETERS) ×3 IMPLANT
CATH ROBINSON RED A/P 16FR (CATHETERS) IMPLANT
CATH ROBINSON RED A/P 20FR (CATHETERS) ×4 IMPLANT
CLOTH BEACON ORANGE TIMEOUT ST (SAFETY) ×4 IMPLANT
COVER BACK TABLE 60X90IN (DRAPES) ×4 IMPLANT
COVER MAYO STAND STRL (DRAPES) ×4 IMPLANT
DRSG TEGADERM 4X4.75 (GAUZE/BANDAGES/DRESSINGS) ×4 IMPLANT
DRSG TEGADERM 8X12 (GAUZE/BANDAGES/DRESSINGS) ×8 IMPLANT
GAUZE SPONGE 4X4 12PLY STRL (GAUZE/BANDAGES/DRESSINGS) ×3 IMPLANT
GEL ULTRASOUND 20GR AQUASONIC (MISCELLANEOUS) ×4 IMPLANT
GLOVE SURG ENC MOIS LTX SZ6.5 (GLOVE) ×4 IMPLANT
GLOVE SURG ENC MOIS LTX SZ7.5 (GLOVE) ×4 IMPLANT
GLOVE SURG ORTHO LTX SZ8.5 (GLOVE) ×4 IMPLANT
GLOVE SURG POLYISO LF SZ7 (GLOVE) ×12 IMPLANT
GLOVE SURG UNDER POLY LF SZ6.5 (GLOVE) IMPLANT
GLOVE SURG UNDER POLY LF SZ7.5 (GLOVE) ×3 IMPLANT
GOWN STRL REUS W/TWL XL LVL3 (GOWN DISPOSABLE) ×4 IMPLANT
GRID BRACH TEMP 18GA 2.8X3X.75 (MISCELLANEOUS) ×4 IMPLANT
HOLDER FOLEY CATH W/STRAP (MISCELLANEOUS) ×4 IMPLANT
IMPL SPACEOAR VUE SYSTEM (Spacer) ×3 IMPLANT
IMPLANT SPACEOAR VUE SYSTEM (Spacer) ×4 IMPLANT
IV NS 1000ML (IV SOLUTION) ×4
IV NS 1000ML BAXH (IV SOLUTION) ×3 IMPLANT
KIT TURNOVER CYSTO (KITS) ×4 IMPLANT
NDL BRACHY 18G 5PK (NEEDLE) ×4 IMPLANT
NDL PK MORGANSTERN STABILIZ (NEEDLE) ×2 IMPLANT
NEEDLE BRACHY 18G 5PK (NEEDLE) ×16 IMPLANT
NEEDLE BRACHY 18G SINGLE (NEEDLE) IMPLANT
NEEDLE PK MORGANSTERN STABILIZ (NEEDLE) ×4 IMPLANT
PACK CYSTO (CUSTOM PROCEDURE TRAY) ×4 IMPLANT
SHEATH ULTRASOUND LF (SHEATH) IMPLANT
SHEATH ULTRASOUND LTX NONSTRL (SHEATH) IMPLANT
SURGILUBE 2OZ TUBE FLIPTOP (MISCELLANEOUS) ×4 IMPLANT
SUT BONE WAX W31G (SUTURE) IMPLANT
SYR 10ML LL (SYRINGE) ×8 IMPLANT
SYR 20ML LL LF (SYRINGE) IMPLANT
TOWEL OR 17X26 10 PK STRL BLUE (TOWEL DISPOSABLE) ×4 IMPLANT
UNDERPAD 30X36 HEAVY ABSORB (UNDERPADS AND DIAPERS) ×8 IMPLANT
WATER STERILE IRR 500ML POUR (IV SOLUTION) ×4 IMPLANT

## 2021-06-12 NOTE — Op Note (Signed)
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 gentleman with clinically localized prostate cancer. After discussing management options for treatment, he elected to proceed with radiotherapy. He presents today for the above procedures. The potential risks, complications, alternative options, and expected recovery course have been discussed in detail with the patient and he has provided informed consent to proceed.  Description of procedure: The patient was taken to the operating room and general anesthesia was induced. He was administered preoperative antibiotics, placed in the dorsal lithotomy position, and prepped and draped in the usual sterile fashion. Next, intraoperative transrectal ultrasonography was utilized for real-time intraoperative planning by the radiation oncology team. Once the treatment plan was completed and the seed strands created, stranded iodine 125 radiation seeds were placed utilizing a brachytherapy perineal template. 48 radioactive iodine 125 seeds into the prostate through 15 catheter needles.  The brachytherapy template was then removed.  A site in the midline was selected on the perineum for placement of an 18 g needle with saline.  The needle was advanced above the rectum and below Denonvillier's fascia to the mid gland and confirmed to be in the midline on transverse imaging.  One cc of saline was injected confirming appropriate expansion of this space.  A total of 5 cc of saline was then injected to open the space further  bilaterally.  The saline syringe was then removed and the SpaceOAR hydrogel was injected with good distribution bilaterally. Position of the radiation seeds was confirmed on fluoroscopic imaging.  Flexible cystoscopy was then performed and no seeds were identified within the bladder.  No bladder tumors, stones, or other mucosal pathology was identified within the bladder. He tolerated the procedure well and without complications. He was able to be transferred to the recovery unit in satisfactory condition.  He was given a voiding trial in the PACU.

## 2021-06-12 NOTE — Transfer of Care (Signed)
Immediate Anesthesia Transfer of Care Note  Patient: MICKLE CAMPTON  Procedure(s) Performed: RADIOACTIVE SEED IMPLANT/BRACHYTHERAPY IMPLANT (Prostate) SPACE OAR INSTILLATION (Rectum) CYSTOSCOPY FLEXIBLE (Bladder)  Patient Location: PACU  Anesthesia Type:General  Level of Consciousness: drowsy  Airway & Oxygen Therapy: Patient Spontanous Breathing and Patient connected to nasal cannula oxygen  Post-op Assessment: Report given to RN  Post vital signs: Reviewed and stable  Last Vitals:  Vitals Value Taken Time  BP 123/73 06/12/21 0854  Temp    Pulse 81 06/12/21 0855  Resp 17 06/12/21 0855  SpO2 91 % 06/12/21 0855  Vitals shown include unvalidated device data.  Last Pain:  Vitals:   06/12/21 0616  TempSrc: Oral  PainSc: 0-No pain      Patients Stated Pain Goal: 6 (90/50/25 6154)  Complications: No notable events documented.

## 2021-06-12 NOTE — Interval H&P Note (Signed)
History and Physical Interval Note:  06/12/2021 7:20 AM  Scott Flynn  has presented today for surgery, with the diagnosis of PROSTATE CANCER.  The various methods of treatment have been discussed with the patient and family. After consideration of risks, benefits and other options for treatment, the patient has consented to  Procedure(s) with comments: RADIOACTIVE SEED IMPLANT/BRACHYTHERAPY IMPLANT (N/A) -       SEEDS IMPLANTED SPACE OAR INSTILLATION (N/A) CYSTOSCOPY FLEXIBLE - NO SEEDS FOUND IN BLADDER as a surgical intervention.  The patient's history has been reviewed, patient examined, no change in status, stable for surgery.  I have reviewed the patient's chart and labs.  Questions were answered to the patient's satisfaction.     Remi Haggard

## 2021-06-12 NOTE — Anesthesia Postprocedure Evaluation (Signed)
Anesthesia Post Note  Patient: Scott Flynn  Procedure(s) Performed: RADIOACTIVE SEED IMPLANT/BRACHYTHERAPY IMPLANT (Prostate) SPACE OAR INSTILLATION (Rectum) CYSTOSCOPY FLEXIBLE (Bladder)     Patient location during evaluation: PACU Anesthesia Type: General Level of consciousness: awake Pain management: pain level controlled Vital Signs Assessment: post-procedure vital signs reviewed and stable Respiratory status: spontaneous breathing Cardiovascular status: stable Postop Assessment: no apparent nausea or vomiting Anesthetic complications: no   No notable events documented.  Last Vitals:  Vitals:   06/12/21 0915 06/12/21 0930  BP: 107/73 120/72  Pulse: 85 81  Resp: 13 10  Temp:    SpO2: 93% 96%    Last Pain:  Vitals:   06/12/21 0915  TempSrc:   PainSc: 3                  Analeya Luallen

## 2021-06-12 NOTE — Discharge Instructions (Signed)
Post Anesthesia Home Care Instructions  Activity: Get plenty of rest for the remainder of the day. A responsible individual must stay with you for 24 hours following the procedure.  For the next 24 hours, DO NOT: -Drive a car -Operate machinery -Drink alcoholic beverages -Take any medication unless instructed by your physician -Make any legal decisions or sign important papers.  Meals: Start with liquid foods such as gelatin or soup. Progress to regular foods as tolerated. Avoid greasy, spicy, heavy foods. If nausea and/or vomiting occur, drink only clear liquids until the nausea and/or vomiting subsides. Call your physician if vomiting continues.  Special Instructions/Symptoms: Your throat may feel dry or sore from the anesthesia or the breathing tube placed in your throat during surgery. If this causes discomfort, gargle with warm salt water. The discomfort should disappear within 24 hours.  PROSTATE CANCER TREATMENT WITH RADIOACTIVE IODINE-125 SEED IMPLANT  This instruction sheet is intended to discuss implantation of Iodine-125 seeds as treatment for cancer of the prostate. It will explain in detail what you may expect from this treatment and what precautions are necessary as a result of the treatment. Iodine-125 emits a relatively low energy radiation. The radioactive seeds are surgically implanted directly into the prostate gland. Most of the radiation is contained within the prostate gland. A very small amount is present outside the body.The precautions that we ask you to take are to ensure that those around you are protected from unnecessary radiation. The principles of radiation safety that you need to understand are:  DISTANCE: The further a person is from the radioactive implant the less radiation they will be receiving. The amount of radiation received falls off quite rapidly with distance. More specific guidelines are given in the table on the last page.  TIME: The amount of  radiation a person is exposed to is directly proportional to the amount of time that is spent in close proximity to the radioactive implant. Very little radiation will be received during short periods. See the table on the last page for more specific guideline.  CHILDREN UNDER AGE 18 Children should not be allowed to sit on your lap or otherwise be in very close contact for more than a few minutes for the first 6-8 weeks following the implant. You may affectionately greet (hug/kiss) a child for a short period of time, but remember, the longer you are in close proximity with that child the more radiation they are being exposed to. At a distance of 6 feet there is no limit to the length of time you may spend together. See specific guidelines on the last page.  PREGNANT OR POSSIBLY PREGNANT WOMEN Pregnant women should avoid prolonged close physical contact with you for the first 6-8 weeks after implant. At a distance of 6 feet there is no limit to the length of time you may spend together. Pregnant women or possibly pregnant women can safely be in close contact with you for a limited period of time. See the last page for guidelines.  FAMILY RELATIONS You may sleep in the same bed as your partner (provided she is not pregnant or under the age of 45). Sexual intercourse, using a condom, may be resumed 2 weeks after the implant. Your semen may be discolored, dark brown or black. This is normal and is the result of bleeding that may have occurred during the implant. After 3-4 weeks it will not be necessary to use a condom.  DAILY ACTIVITIES You may resume normal activities in a   few days (example: work, shopping, church) without the risk of harmful radiation exposure to those around you provided you keep in mind the time and distance precautions. Objects that you touch or item that you use do not become radioactive. Linens, clothing, tableware, and dishes may be used by other persons without special  precautions. Your bodily wastes (urine and stool) are not radioactive.  SPECIAL PRECAUTIONS It is possible to lose implanted Iodine-125 seed(s) through urination. Although it is possible to pass seeds indefinitely, it is most likely to occur immediately after catheter removal. To prevent this from happening the catheter that was in place during the implant procedure is removed immediately after the implant and a cystoscopy procedure is performed. The process of removing the catheter and the cystoscopy procedure should dislodge and remove any seeds that are not firmly imbedded in the prostate tissue. However, you should watch for seeds if/when you remove your catheter at home. The seeds are silver colored and the size of a grain of rice. In the unlikely event that a seed is seen after urination, simply flush the seed down the toilet. The seed should not be handled with your fingers, not even with a glove or napkin. A spoon or tweezers can be used to pick up a seed. The Radiation Oncology department is open Monday - Friday from 8:00 am to 5:30 pm with a Radiation Oncologist on call at all times. He or she may be reached by calling [phone]. If you are to be hospitalized or if death should occur, your family should notify the Radiation Safety officer.  SIDE EFFECTS There are very few side effects associate with the implant procedure. Minor burning with urination, weak stream, hesitancy, intermittency, frequency, mild pain or feeling unable to pass your urine freely are common and usually stop in one to four months. If these symptoms are extremely uncomfortable, contact your physician.  RADIATION SAFETY GUIDELINES PROSTATE CANCER TREATMENT WITH RADIOACTIVE IODINE-125 SEED IMPLANT  The following guidelines will limit exposure to less than naturally occurring background radiation.  PERSONS AGE 18-45 (if able to become pregnant)  FOR 8 WEEKS FOLLOWING IMPLANT  At a distance of 1 foot: limit time to  less than 2 hours/week At a distance of 3 feet: limit time to 20 hours/week At a distance of 6 feet: no restrictions  AFTER 8 WEEKS No restrictions  CHILDREN UNDER AGE 18, PREGNANT WOMEN OR POSSIBLY PREGNANT WOMEN  FOR 8 WEEKS FOLLOWING IMPLANT At a distance of 1 foot: limit time to 10 minutes/week At a distance of 3 feet: limit time to 2 hours/week At a distance of 6 feet: no restrictions  AFTER 8 WEEKS No restrictions  PERSONS OVER THE AGE OF 45 AND DO NOT EXPECT TO HAVE ANY MORE CHILDREN No restrictions  Updated by [facility] in January 2020 

## 2021-06-12 NOTE — Anesthesia Procedure Notes (Signed)
Procedure Name: Intubation Date/Time: 06/12/2021 7:43 AM Performed by: Rogers Blocker, CRNA Pre-anesthesia Checklist: Patient identified, Emergency Drugs available, Suction available and Patient being monitored Patient Re-evaluated:Patient Re-evaluated prior to induction Oxygen Delivery Method: Circle System Utilized Preoxygenation: Pre-oxygenation with 100% oxygen Induction Type: IV induction Ventilation: Mask ventilation without difficulty Laryngoscope Size: Mac and 4 Grade View: Grade I Tube type: Oral Tube size: 7.5 mm Number of attempts: 1 Airway Equipment and Method: Stylet Placement Confirmation: ETT inserted through vocal cords under direct vision, positive ETCO2 and breath sounds checked- equal and bilateral Secured at: 22 cm Tube secured with: Tape Dental Injury: Teeth and Oropharynx as per pre-operative assessment

## 2021-06-12 NOTE — Progress Notes (Signed)
  Radiation Oncology         [phone] ________________________________  Name: Scott Flynn MRN: [id number]  Date: 06/12/2021  DOB: Jan 01, 1951       Prostate Seed Implant  AX:KPVVZ, Henderson Newcomer, MD  No ref. provider found  DIAGNOSIS:  Oncology History  Malignant neoplasm of prostate ([place])  03/26/2021 Cancer Staging   Staging form: Prostate, AJCC 8th Edition - Clinical stage from 03/26/2021: Stage IIB (cT1c, cN0, cM0, PSA: 9.8, Grade Group: 2) - Signed by Freeman Caldron, PA-C on 04/17/2021 Histopathologic type: Adenocarcinoma, NOS Stage prefix: Initial diagnosis Prostate specific antigen (PSA) range: Less than 10 Gleason primary pattern: 3 Gleason secondary pattern: 4 Gleason score: 7 Histologic grading system: 5 grade system    04/17/2021 Initial Diagnosis   Malignant neoplasm of prostate ([place])     No diagnosis found.  PROCEDURE: Insertion of radioactive I-125 seeds into the prostate gland.  RADIATION DOSE: 145 Gy, definitive therapy.  TECHNIQUE: [name] was brought to the operating room with the urologist. He was placed in the dorsolithotomy position. He was catheterized and a rectal tube was inserted. The perineum was shaved, prepped and draped. The ultrasound probe was then introduced into the rectum to see the prostate gland.  TREATMENT DEVICE: A needle grid was attached to the ultrasound probe stand and anchor needles were placed.  3D PLANNING: The prostate was imaged in 3D using a sagittal sweep of the prostate probe. These images were transferred to the planning computer. There, the prostate, urethra and rectum were defined on each axial reconstructed image. Then, the software created an optimized 3D plan and a few seed positions were adjusted. The quality of the plan was reviewed using [facility] information for the target and the following two organs at risk:  Urethra and Rectum.  Then the accepted plan was printed and handed off to the radiation therapist.   Under my supervision, the custom loading of the seeds and spacers was carried out and loaded into sealed vicryl sleeves.  These pre-loaded needles were then placed into the needle holder.Marland Kitchen  PROSTATE VOLUME STUDY:  Using transrectal ultrasound the volume of the prostate was verified to be 45.7 cc.  SPECIAL TREATMENT PROCEDURE/SUPERVISION AND HANDLING: The pre-loaded needles were then delivered under sagittal guidance. A total of 15 needles were used to deposit 59 seeds in the prostate gland. The individual seed activity was 0.52 mCi.  SpaceOAR:  Yes, good placement especially at base and SV region  COMPLEX SIMULATION: At the end of the procedure, an anterior radiograph of the pelvis was obtained to document seed positioning and count. Cystoscopy was performed to check the urethra and bladder.  MICRODOSIMETRY: At the end of the procedure, the patient was emitting 0.11 mR/hr at 1 meter. Accordingly, he was considered safe for hospital discharge.  PLAN: The patient will return to the radiation oncology clinic for post implant CT dosimetry in three weeks.   ________________________________  Sheral Apley Tammi Klippel, M.D.

## 2021-06-12 NOTE — Addendum Note (Signed)
Addendum  created 06/12/21 1123 by Bonney Aid, CRNA   Charge Capture section accepted

## 2021-06-13 ENCOUNTER — Telehealth: Payer: Self-pay | Admitting: *Deleted

## 2021-06-13 ENCOUNTER — Encounter (HOSPITAL_BASED_OUTPATIENT_CLINIC_OR_DEPARTMENT_OTHER): Payer: Self-pay | Admitting: Urology

## 2021-06-13 NOTE — Telephone Encounter (Signed)
RETURNED PATIENT'S PHONE CALL, SPOKE WITH PATIENT. ?

## 2021-07-11 ENCOUNTER — Telehealth: Payer: Self-pay | Admitting: *Deleted

## 2021-07-11 NOTE — Telephone Encounter (Signed)
CALLED PATIENT TO REMIND OF POST SEED APPTS. FOR 07-12-21, SPOKE WITH PATIENT AND HE IS AWARE OF THESE APPTS.

## 2021-07-12 ENCOUNTER — Encounter: Payer: Self-pay | Admitting: Urology

## 2021-07-12 ENCOUNTER — Other Ambulatory Visit: Payer: Self-pay

## 2021-07-12 ENCOUNTER — Ambulatory Visit
Admission: RE | Admit: 2021-07-12 | Discharge: 2021-07-12 | Disposition: A | Discharge status: Home / Self Care | Payer: Medicare Other | Source: Ambulatory Visit | Attending: Radiation Oncology | Admitting: Radiation Oncology

## 2021-07-12 ENCOUNTER — Ambulatory Visit
Admission: RE | Admit: 2021-07-12 | Discharge: 2021-07-12 | Disposition: A | Discharge status: Home / Self Care | Payer: Medicare Other | Source: Ambulatory Visit | Attending: Urology | Admitting: Urology

## 2021-07-12 VITALS — BP 96/65 | HR 60 | Temp 97.5°F | Resp 20 | Ht 74.0 in | Wt 216.6 lb

## 2021-07-12 DIAGNOSIS — C61 Malignant neoplasm of prostate: Secondary | ICD-10-CM | POA: Diagnosis present

## 2021-07-12 NOTE — Progress Notes (Signed)
°  Radiation Oncology         [phone]) [phone] ________________________________  Name: Scott Flynn MRN: [id number]  Date: 07/12/2021  DOB: 09-10-1950  COMPLEX SIMULATION NOTE  NARRATIVE:  The patient was brought to the [facility] suite today following prostate seed implantation approximately one month ago.  Identity was confirmed.  All relevant records and images related to the planned course of therapy were reviewed.  Then, the patient was set-up supine.  CT images were obtained.  The CT images were loaded into the planning software.  Then the prostate and rectum were contoured.  Treatment planning then occurred.  The implanted iodine 125 seeds were identified by the physics staff for projection of radiation distribution  I have requested : 3D Simulation  I have requested a DVH of the following structures: Prostate and rectum.    ________________________________  Sheral Apley Tammi Klippel, M.D.

## 2021-07-12 NOTE — Progress Notes (Signed)
Radiation Oncology         (336) [phone] ________________________________  Name: Scott Flynn MRN: [id number]  Date: 07/12/2021  DOB: Oct 23, 1950  Post-Seed Follow-Up Visit Note  CC: Elwyn Reach, MD  Remi Haggard, MD  Diagnosis:   71 y.o. 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    ICD-10-CM   1. Malignant neoplasm of prostate ([place])  C61       Interval Since Last Radiation:  4 weeks 06/12/21:  Insertion of radioactive I-125 seeds into the prostate gland; 145 Gy, definitive therapy with placement of SpaceOAR gel.  Narrative:  The patient returns today for routine follow-up.  He is complaining of increased urinary frequency and urinary hesitation symptoms. He filled out a questionnaire regarding urinary function today providing and overall IPSS score of 21 characterizing his symptoms as severe with persistent dysuria at the start of his stream, increased frequency, urgency, weaker flow of stream, hesitancy, intermittency and nocturia x2 despite taking Flomax twice daily.  His pre-implant score was 13 on Flomax daily. He denies any abdominal pain or bowel symptoms.  ALLERGIES:  has No Known Allergies.  Meds: Current Outpatient Medications  Medication Sig Dispense Refill   ALPRAZolam (XANAX) 1 MG tablet Take 1 mg by mouth 3 (three) times daily.     aspirin 81 MG chewable tablet Chew 81 mg by mouth daily.     Lifitegrast (XIIDRA) 5 % SOLN Apply to eye. 1 drop both eyes bid     tamsulosin (FLOMAX) 0.4 MG CAPS capsule Take 0.4 mg by mouth every evening.     traMADol (ULTRAM) 50 MG tablet Take 1 tablet (50 mg total) by mouth every 6 (six) hours as needed. 20 tablet 0   valACYclovir (VALTREX) 500 MG tablet Take by mouth as needed.     No current facility-administered medications for this visit.    Physical Findings: In general this is a well appearing Caucasian male in no acute distress. He's alert and oriented x4  and appropriate throughout the examination. Cardiopulmonary assessment is negative for acute distress and he exhibits normal effort.   Lab Findings: Lab Results  Component Value Date   WBC 6.0 06/04/2021   HGB 14.5 06/04/2021   HCT 42.0 06/04/2021   MCV 93.3 06/04/2021   PLT 173 06/04/2021    Radiographic Findings:  Patient underwent CT imaging in our clinic for post implant dosimetry. The CT will be reviewed by Dr. Tammi Klippel to confirm there is an adequate distribution of radioactive seeds throughout the prostate gland and ensure that there are no seeds in or near the rectum.  We suspect the final radiation plan and dosimetry will show appropriate coverage of the prostate gland. He understands that we will call and inform him of any unexpected findings on further review of his imaging and dosimetry.  Impression/Plan: 71 y.o. 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 The patient is recovering from the effects of radiation. His urinary symptoms should gradually improve over the next 4-6 months. We talked about this today. He is encouraged by his improvement already and is otherwise pleased with his outcome. We also talked about long-term follow-up for prostate cancer following seed implant. He understands that ongoing PSA determinations and digital rectal exams will help perform surveillance to rule out disease recurrence. He has a follow up appointment scheduled with Dr. Milford Cage this afternoon at 1:30 PM.  He understands what to expect with his PSA measures. Patient was also educated today about some of the long-term effects from radiation including a small risk for rectal bleeding and possibly erectile dysfunction. We talked about some of the general management approaches to these potential complications. However, I did encourage the patient to contact our office or return at any point if he has questions or concerns related to his previous  radiation and prostate cancer.    Nicholos Johns, PA-C

## 2021-07-12 NOTE — Progress Notes (Signed)
Patient reports dysuria, w/ urgency and frequency. No other symptoms reported at this time.  Meaningful use complete. I-PSS score of 21 (severe).  Currently on Flomax 0.4mg  daily. Urology appointment w/ [facility] scheduled for today-07/12/21 at 2:30pm.  BP 96/65 (BP Location: Right Arm, Patient Position: Sitting, Cuff Size: Large)    Pulse 60    Temp (!) 97.5 F (36.4 C)    Resp 20    Ht 6\' 2"  (1.88 m)    Wt 216 lb 9.6 oz (98.2 kg)    SpO2 99%    BMI 27.81 kg/m

## 2021-07-20 ENCOUNTER — Encounter: Payer: Self-pay | Admitting: Radiation Oncology

## 2021-07-20 DIAGNOSIS — C61 Malignant neoplasm of prostate: Secondary | ICD-10-CM | POA: Diagnosis not present

## 2021-07-20 NOTE — Progress Notes (Signed)
°  Radiation Oncology         (336) [phone] ________________________________  Name: Scott Flynn MRN: [id number]  Date: 07/20/2021  DOB: 09/13/1950  3D Planning Note   Prostate Brachytherapy Post-Implant Dosimetry  Diagnosis: 71 y.o. 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  Narrative: On a previous date, Scott Flynn returned following prostate seed implantation for post implant planning. He underwent CT scan complex simulation to delineate the three-dimensional structures of the pelvis and demonstrate the radiation distribution.  Since that time, the seed localization, and complex isodose planning with dose volume histograms have now been completed.  Results:   Prostate Coverage - The dose of radiation delivered to the 90% or more of the prostate gland (D90) was 109.33% of the prescription dose. This exceeds our goal of greater than 90%. Rectal Sparing - The volume of rectal tissue receiving the prescription dose or higher was 0.0 cc. This falls under our thresholds tolerance of 1.0 cc.  Impression: The prostate seed implant appears to show adequate target coverage and appropriate rectal sparing.  Plan:  The patient will continue to follow with urology for ongoing PSA determinations. I would anticipate a high likelihood for local tumor control with minimal risk for rectal morbidity.  ________________________________  Sheral Apley Tammi Klippel, M.D.

## 2021-09-20 ENCOUNTER — Telehealth: Payer: Self-pay

## 2021-09-20 NOTE — Telephone Encounter (Signed)
Patient urinary concerns relayed to Ashlyn Bruning PA-C and I was instructed by Ms. Bruning to get him into [facility] right away. ? ?Patient appointment scheduled w/ [facility] tomorrow-09/21/21 at 10:30am w/ Dr. Milford Cage. Advised patient to arrive 34mn early for check-in. Patient notified of urology appointment. Patient verbally understood information given and was very happy to get in to urology so quickly. I told patient to call if he needs anything. ?

## 2021-09-20 NOTE — Telephone Encounter (Signed)
Returned patient call, and verified identity.  ? ?Patient reports dysuria, polyuria, nocturia x6, and prostate discomfort 4/10. He also had questions regarding what type of pillow he should sit on. He has a traditional donut but was told by another nurse that it may put too much downward pressure on his prostate area. He also wants to know if AZO is used to control or heal urinary symptoms. I told him that it is typically used to ease urinary symptoms while healing takes place, but that it is not ment to solely treat a UTI. I told patient that I would relay his concerns to [employer] and that I would return his call within 24hrs. I left my extension [phone] in case he needs anything. ?

## 2022-11-30 IMAGING — DX DG CHEST 2V
2 series · 2 of 2 positions shown · non-contrast
Comparison: None.

CLINICAL DATA: Preoperative evaluation for prostate seed implant.

EXAM:
CHEST - 2 VIEW

[chest pa]
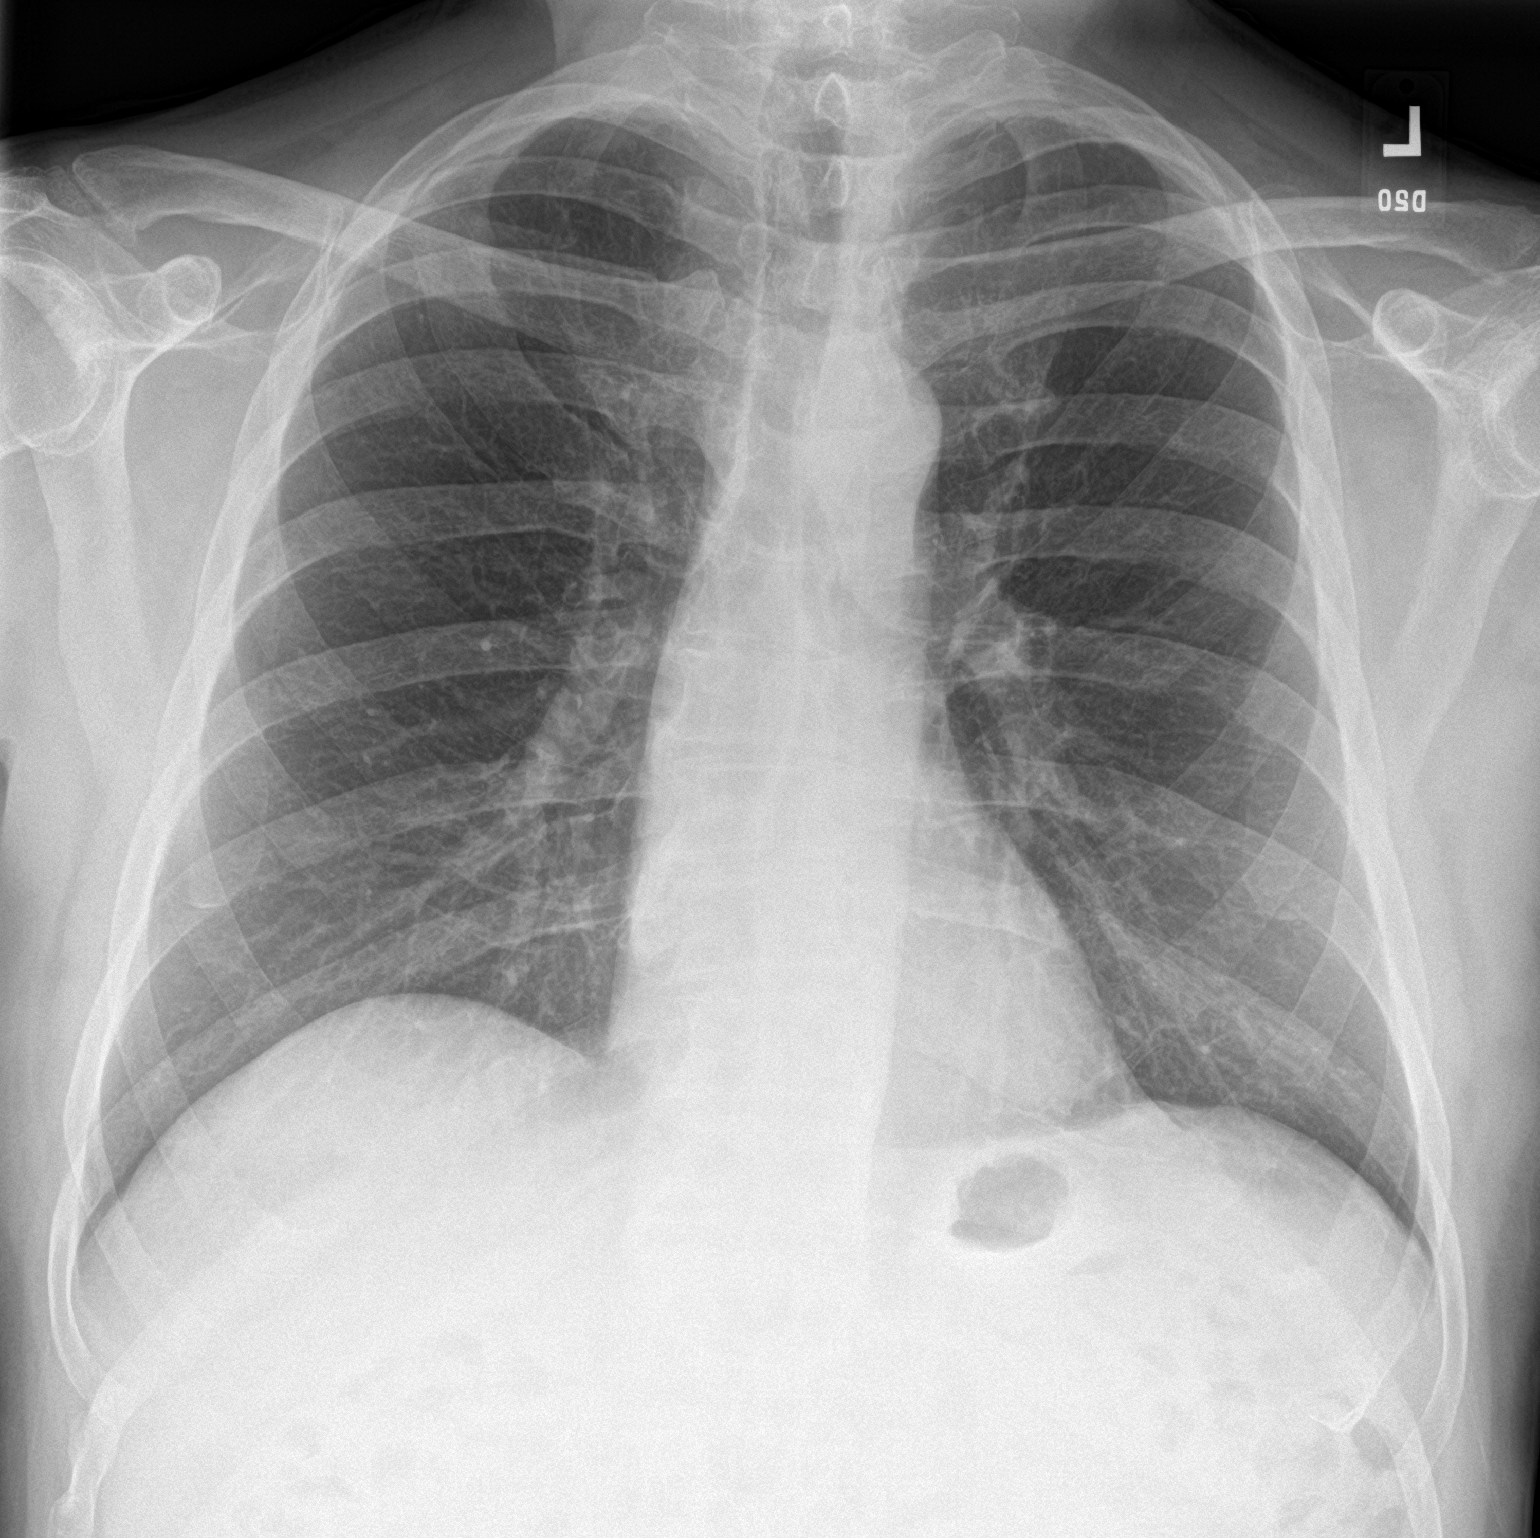

[chest lat]
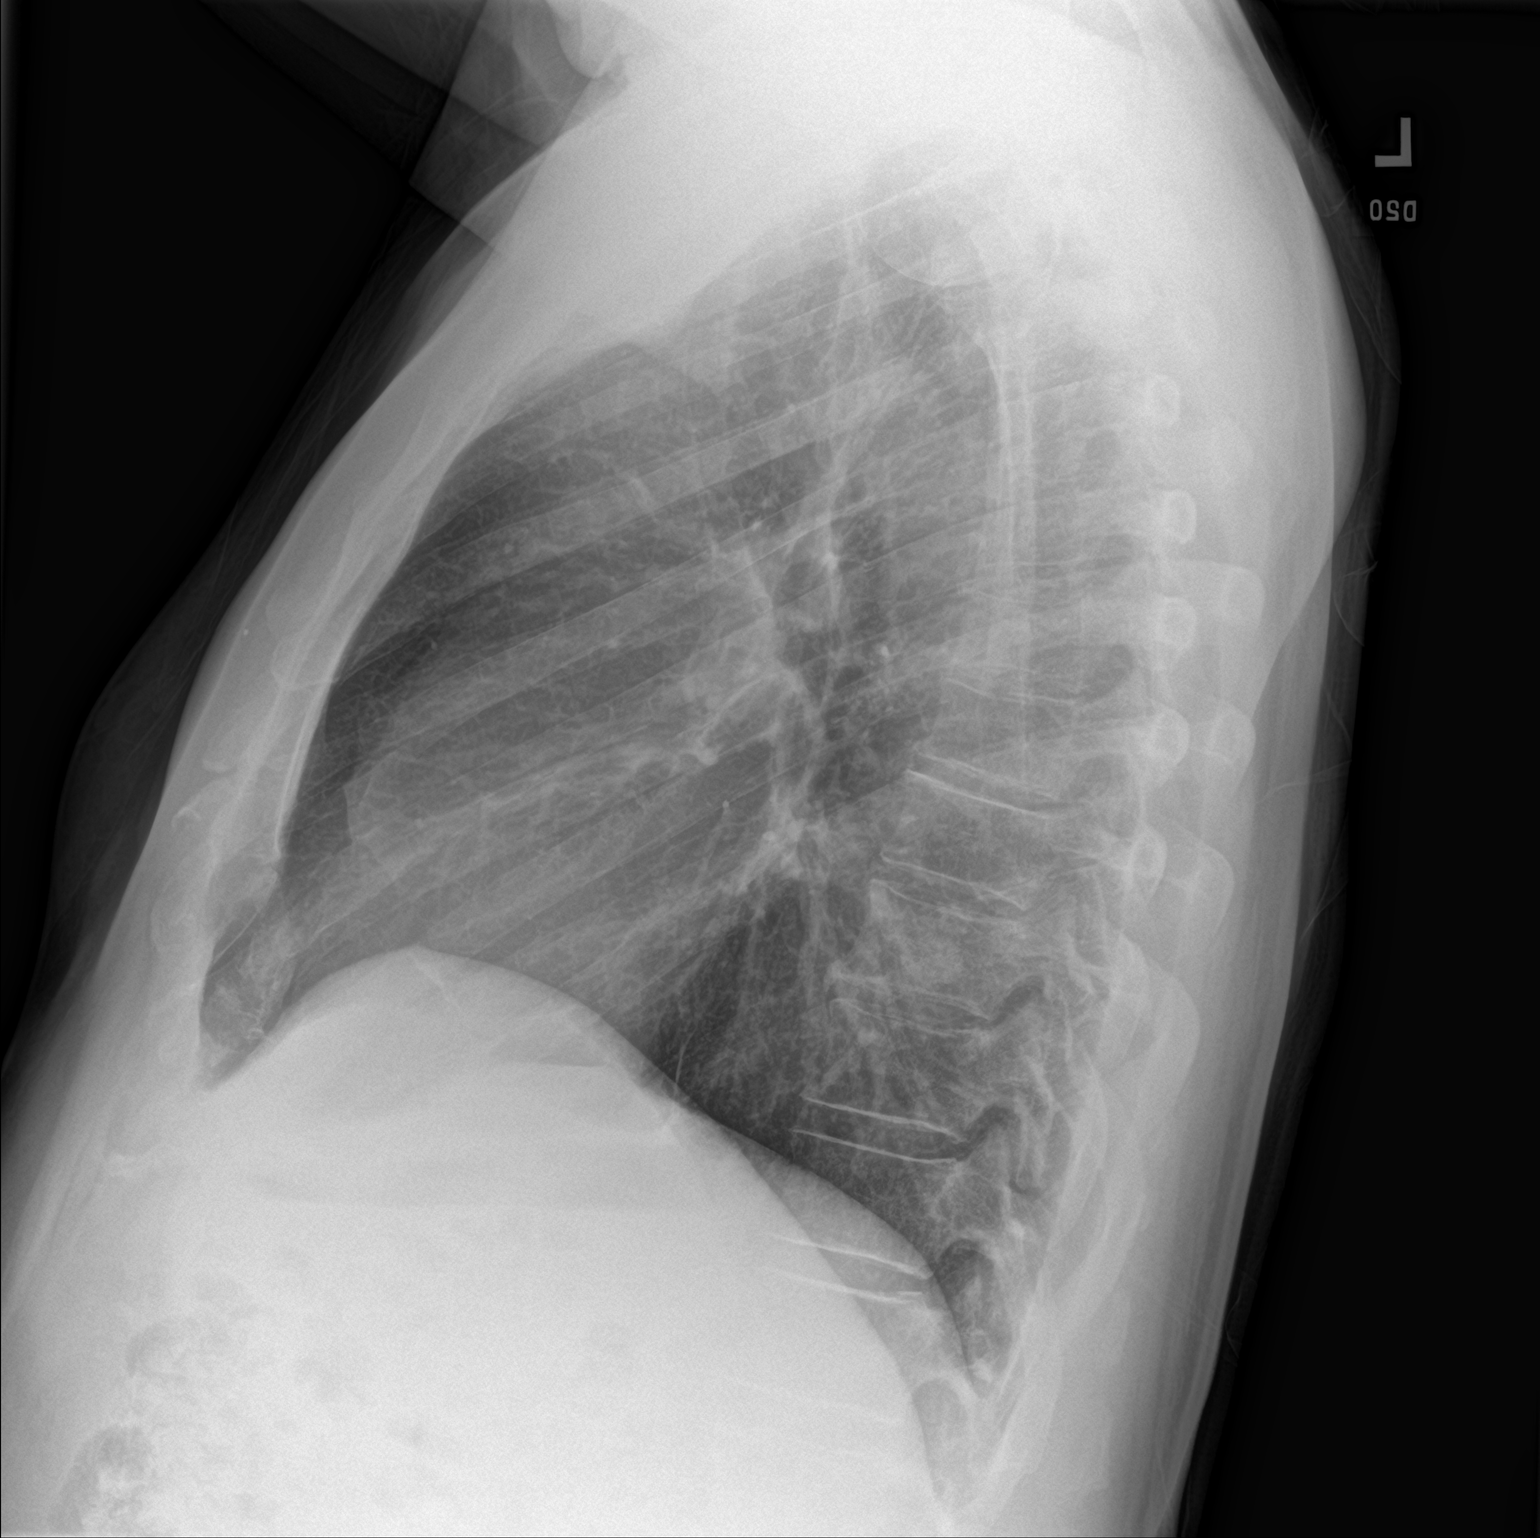

[2 of 2 positions shown; findings below may reference images not displayed]

FINDINGS: The heart size and mediastinal contours are within normal limits.
Both lungs are clear. The visualized skeletal structures are
unremarkable.
IMPRESSION: No active cardiopulmonary disease.
